# Patient Record
Sex: Male | Born: 1980 | Race: Black or African American | Hispanic: No | Smoking: Current every day smoker
Health system: Southern US, Community
[De-identification: ages and names within clinical notes are randomized; demographics above are authoritative.]

## PROBLEM LIST (undated history)

## (undated) ENCOUNTER — Emergency Department (HOSPITAL_COMMUNITY): Admission: EM | Disposition: A | Discharge status: Home / Self Care | Payer: Medicaid Other

## (undated) HISTORY — PX: NO PAST SURGERIES: SHX2092

---

## 2008-06-25 ENCOUNTER — Emergency Department (HOSPITAL_COMMUNITY): Admission: EM | Admit: 2008-06-25 | Discharge: 2008-06-25 | Payer: Self-pay | Admitting: Emergency Medicine

## 2008-06-26 ENCOUNTER — Emergency Department (HOSPITAL_COMMUNITY): Admission: EM | Admit: 2008-06-26 | Discharge: 2008-06-26 | Payer: Self-pay | Admitting: Emergency Medicine

## 2010-07-15 ENCOUNTER — Emergency Department (HOSPITAL_COMMUNITY): Admission: EM | Admit: 2010-07-15 | Discharge: 2010-07-16 | Payer: Self-pay | Admitting: Emergency Medicine

## 2010-09-05 ENCOUNTER — Emergency Department (HOSPITAL_COMMUNITY): Admission: EM | Admit: 2010-09-05 | Discharge: 2010-09-05 | Payer: Self-pay | Admitting: Family Medicine

## 2010-11-11 ENCOUNTER — Emergency Department (HOSPITAL_COMMUNITY)
Admission: EM | Admit: 2010-11-11 | Discharge: 2010-11-11 | Payer: Self-pay | Source: Home / Self Care | Admitting: Emergency Medicine

## 2011-01-02 ENCOUNTER — Emergency Department (HOSPITAL_COMMUNITY)
Admission: EM | Admit: 2011-01-02 | Discharge: 2011-01-02 | Disposition: A | Discharge status: Home / Self Care | Payer: Medicaid Other | Attending: Emergency Medicine | Admitting: Emergency Medicine

## 2011-01-02 DIAGNOSIS — J45909 Unspecified asthma, uncomplicated: Secondary | ICD-10-CM | POA: Insufficient documentation

## 2011-01-03 ENCOUNTER — Observation Stay (HOSPITAL_COMMUNITY)
Admission: EM | Admit: 2011-01-03 | Discharge: 2011-01-05 | Disposition: A | Discharge status: Home / Self Care | Payer: Medicaid Other | Attending: Infectious Diseases | Admitting: Infectious Diseases

## 2011-01-03 ENCOUNTER — Emergency Department (HOSPITAL_COMMUNITY): Payer: Medicaid Other

## 2011-01-03 DIAGNOSIS — J339 Nasal polyp, unspecified: Secondary | ICD-10-CM | POA: Insufficient documentation

## 2011-01-03 DIAGNOSIS — R0602 Shortness of breath: Secondary | ICD-10-CM

## 2011-01-03 DIAGNOSIS — R0609 Other forms of dyspnea: Secondary | ICD-10-CM

## 2011-01-03 DIAGNOSIS — R079 Chest pain, unspecified: Secondary | ICD-10-CM | POA: Insufficient documentation

## 2011-01-03 DIAGNOSIS — F17200 Nicotine dependence, unspecified, uncomplicated: Secondary | ICD-10-CM | POA: Insufficient documentation

## 2011-01-03 DIAGNOSIS — R059 Cough, unspecified: Secondary | ICD-10-CM | POA: Insufficient documentation

## 2011-01-03 DIAGNOSIS — R0989 Other specified symptoms and signs involving the circulatory and respiratory systems: Secondary | ICD-10-CM | POA: Insufficient documentation

## 2011-01-03 DIAGNOSIS — R05 Cough: Secondary | ICD-10-CM | POA: Insufficient documentation

## 2011-01-03 DIAGNOSIS — J45901 Unspecified asthma with (acute) exacerbation: Principal | ICD-10-CM | POA: Insufficient documentation

## 2011-01-03 LAB — CK TOTAL AND CKMB (NOT AT ARMC)
CK, MB: 5.8 ng/mL — ABNORMAL HIGH (ref 0.3–4.0)
Relative Index: 1.4 (ref 0.0–2.5)

## 2011-01-03 LAB — TSH: TSH: 0.273 u[IU]/mL — ABNORMAL LOW (ref 0.350–4.500)

## 2011-01-03 LAB — D-DIMER, QUANTITATIVE: D-Dimer, Quant: <0.22 ug/mL-FEU (ref 0.00–0.48)

## 2011-01-03 LAB — POCT CARDIAC MARKERS

## 2011-01-03 LAB — HIV ANTIBODY (ROUTINE TESTING W REFLEX): HIV: NONREACTIVE

## 2011-01-03 LAB — RAPID URINE DRUG SCREEN, HOSP PERFORMED
Opiates: NOT DETECTED
Tetrahydrocannabinol: NOT DETECTED

## 2011-01-03 LAB — TROPONIN I: Troponin I: <0.01 ng/mL (ref 0.00–0.06)

## 2011-01-03 LAB — HEPATITIS B SURFACE ANTIGEN: Hepatitis B Surface Ag: NEGATIVE

## 2011-01-04 ENCOUNTER — Observation Stay (HOSPITAL_COMMUNITY): Payer: Medicaid Other

## 2011-01-04 LAB — BASIC METABOLIC PANEL
BUN: 13 mg/dL (ref 6–23)
CO2: 26 mEq/L (ref 19–32)
Calcium: 9.4 mg/dL (ref 8.4–10.5)
Chloride: 105 mEq/L (ref 96–112)
Creatinine, Ser: 1.11 mg/dL (ref 0.4–1.5)
Glucose, Bld: 118 mg/dL — ABNORMAL HIGH (ref 70–99)

## 2011-01-04 LAB — CBC
HCT: 39.3 % (ref 39.0–52.0)
Platelets: 335 10*3/uL (ref 150–400)
RDW: 14.1 % (ref 11.5–15.5)
WBC: 19.1 10*3/uL — ABNORMAL HIGH (ref 4.0–10.5)

## 2011-01-05 DIAGNOSIS — R0609 Other forms of dyspnea: Secondary | ICD-10-CM

## 2011-01-05 DIAGNOSIS — R0989 Other specified symptoms and signs involving the circulatory and respiratory systems: Secondary | ICD-10-CM

## 2011-01-05 DIAGNOSIS — R0602 Shortness of breath: Secondary | ICD-10-CM

## 2011-01-05 LAB — BASIC METABOLIC PANEL
BUN: 18 mg/dL (ref 6–23)
Chloride: 105 mEq/L (ref 96–112)
Glucose, Bld: 127 mg/dL — ABNORMAL HIGH (ref 70–99)
Potassium: 4.4 mEq/L (ref 3.5–5.1)
Sodium: 142 mEq/L (ref 135–145)

## 2011-01-05 LAB — CBC
HCT: 41.9 % (ref 39.0–52.0)
MCV: 84.6 fL (ref 78.0–100.0)
Platelets: 342 10*3/uL (ref 150–400)
RBC: 4.95 MIL/uL (ref 4.22–5.81)
WBC: 17.1 10*3/uL — ABNORMAL HIGH (ref 4.0–10.5)

## 2011-01-05 LAB — CK TOTAL AND CKMB (NOT AT ARMC)
Relative Index: 1.4 (ref 0.0–2.5)
Total CK: 174 U/L (ref 7–232)

## 2011-01-06 LAB — CULTURE, RESPIRATORY W GRAM STAIN

## 2011-01-24 ENCOUNTER — Encounter: Payer: Self-pay | Admitting: Internal Medicine

## 2011-01-24 ENCOUNTER — Ambulatory Visit (INDEPENDENT_AMBULATORY_CARE_PROVIDER_SITE_OTHER): Payer: Medicaid Other | Admitting: Internal Medicine

## 2011-01-24 DIAGNOSIS — R946 Abnormal results of thyroid function studies: Secondary | ICD-10-CM | POA: Insufficient documentation

## 2011-01-24 DIAGNOSIS — J45909 Unspecified asthma, uncomplicated: Secondary | ICD-10-CM | POA: Insufficient documentation

## 2011-01-24 MED ORDER — ALBUTEROL SULFATE (5 MG/ML) 0.5% IN NEBU: 2.5000 mg | INHALATION_SOLUTION | Freq: Four times a day (QID) | RESPIRATORY_TRACT | Status: DC | PRN

## 2011-01-24 MED ORDER — FLUTICASONE PROPIONATE 50 MCG/ACT NA SUSP: 2.0000 | Freq: Every day | NASAL | Status: AC

## 2011-01-24 MED ORDER — IPRATROPIUM BROMIDE 0.02 % IN SOLN: 500.0000 ug | Freq: Four times a day (QID) | RESPIRATORY_TRACT | Status: DC

## 2011-01-24 MED ORDER — IPRATROPIUM-ALBUTEROL 18-103 MCG/ACT IN AERO: 2.0000 | INHALATION_SPRAY | Freq: Four times a day (QID) | RESPIRATORY_TRACT | Status: DC | PRN

## 2011-01-24 MED ORDER — FLUTICASONE PROPIONATE HFA 44 MCG/ACT IN AERO: 1.0000 | INHALATION_SPRAY | Freq: Two times a day (BID) | RESPIRATORY_TRACT | Status: AC

## 2011-01-24 NOTE — Assessment & Plan Note (Signed)
Incidental finding during hospitalization for asthma exacerbation. 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 Recheck TSH and T4 on follow up.

## 2011-01-24 NOTE — Patient Instructions (Signed)
Make follow up appointment in 6 months or as needed. Continue taking medication as directed.

## 2011-01-24 NOTE — Assessment & Plan Note (Signed)
Intermittent asthma. Stable. Continue current regimen.

## 2011-01-24 NOTE — Progress Notes (Signed)
  Subjective:    Patient ID: Nicholas [name], male    DOB: 1981/05/08, 30 y.o.   MRN: [id number]  HPI  30 yr old man with pmhx of Asthma  Comes to the clinic for follow up of hospital admission for asthma exacerbation  secondary to tobacco abuse.  Patient has no complaints. He reports that he has stopped smoking. Patient reports that since being discharged he has symptoms of wheezing less than 2 times per week  that are well controlled with medication.   Review of Systems  [all other systems reviewed and are negative       Objective:   Physical Exam  Constitutional: He is oriented to person, place, and time. He appears well-developed and well-nourished.  HENT:  Mouth/Throat: Oropharynx is clear and moist.  Eyes: Conjunctivae and EOM are normal. Pupils are equal, round, and reactive to light.  Neck: Normal range of motion. Neck supple.  Cardiovascular: Normal rate, regular rhythm and normal heart sounds.   Pulmonary/Chest: Effort normal and breath sounds normal.  Abdominal: Soft. Bowel sounds are normal.  Musculoskeletal: Normal range of motion.  Neurological: He is alert and oriented to person, place, and time.  Psychiatric: He has a normal mood and affect.          Assessment & Plan:
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
## 2011-08-05 ENCOUNTER — Emergency Department (HOSPITAL_COMMUNITY)
Admission: EM | Admit: 2011-08-05 | Discharge: 2011-08-06 | Disposition: A | Discharge status: Home / Self Care | Payer: Medicaid Other | Attending: Emergency Medicine | Admitting: Emergency Medicine

## 2011-08-05 DIAGNOSIS — R0602 Shortness of breath: Secondary | ICD-10-CM | POA: Insufficient documentation

## 2011-08-05 DIAGNOSIS — J45901 Unspecified asthma with (acute) exacerbation: Secondary | ICD-10-CM | POA: Insufficient documentation

## 2011-08-23 LAB — URINALYSIS, ROUTINE W REFLEX MICROSCOPIC
Bilirubin Urine: NEGATIVE
Glucose, UA: NEGATIVE
Ketones, ur: NEGATIVE
Ketones, ur: NEGATIVE
Nitrite: NEGATIVE
Nitrite: NEGATIVE
Protein, ur: NEGATIVE
Specific Gravity, Urine: 1.026
Urobilinogen, UA: 1
pH: 6

## 2011-08-23 LAB — URINE MICROSCOPIC-ADD ON

## 2011-08-23 LAB — HEPATIC FUNCTION PANEL
AST: 35
Bilirubin, Direct: 0.3
Indirect Bilirubin: 0.8
Total Bilirubin: 1.1

## 2011-08-23 LAB — CBC
HCT: 49
Hemoglobin: 15.9
MCHC: 32.4
MCV: 82.8
RBC: 5.92 — ABNORMAL HIGH
RDW: 15.3

## 2011-08-23 LAB — POCT I-STAT, CHEM 8
Chloride: 110
Glucose, Bld: 93
HCT: 52
Hemoglobin: 17.7 — ABNORMAL HIGH
Potassium: 4.4

## 2011-08-23 LAB — DIFFERENTIAL
Basophils Absolute: 0
Basophils Relative: 0
Eosinophils Absolute: 0.1
Eosinophils Relative: 1
Monocytes Absolute: 0.7
Monocytes Relative: 8

## 2011-09-22 ENCOUNTER — Emergency Department (HOSPITAL_COMMUNITY)
Admission: EM | Admit: 2011-09-22 | Discharge: 2011-09-22 | Disposition: A | Discharge status: Home / Self Care | Payer: Medicaid Other | Attending: Emergency Medicine | Admitting: Emergency Medicine

## 2011-09-22 DIAGNOSIS — F17200 Nicotine dependence, unspecified, uncomplicated: Secondary | ICD-10-CM | POA: Insufficient documentation

## 2011-09-22 DIAGNOSIS — J45902 Unspecified asthma with status asthmaticus: Secondary | ICD-10-CM | POA: Insufficient documentation

## 2012-04-13 ENCOUNTER — Other Ambulatory Visit: Payer: Self-pay | Admitting: Internal Medicine

## 2012-10-03 ENCOUNTER — Emergency Department (HOSPITAL_COMMUNITY)
Admission: EM | Admit: 2012-10-03 | Discharge: 2012-10-03 | Disposition: A | Discharge status: Home / Self Care | Payer: Medicaid Other | Attending: Emergency Medicine | Admitting: Emergency Medicine

## 2012-10-03 ENCOUNTER — Other Ambulatory Visit: Payer: Self-pay

## 2012-10-03 ENCOUNTER — Emergency Department (HOSPITAL_COMMUNITY): Payer: Medicaid Other

## 2012-10-03 ENCOUNTER — Encounter (HOSPITAL_COMMUNITY): Payer: Self-pay | Admitting: Emergency Medicine

## 2012-10-03 DIAGNOSIS — R05 Cough: Secondary | ICD-10-CM | POA: Insufficient documentation

## 2012-10-03 DIAGNOSIS — F17200 Nicotine dependence, unspecified, uncomplicated: Secondary | ICD-10-CM | POA: Insufficient documentation

## 2012-10-03 DIAGNOSIS — J45901 Unspecified asthma with (acute) exacerbation: Secondary | ICD-10-CM | POA: Insufficient documentation

## 2012-10-03 DIAGNOSIS — R059 Cough, unspecified: Secondary | ICD-10-CM | POA: Insufficient documentation

## 2012-10-03 DIAGNOSIS — Z79899 Other long term (current) drug therapy: Secondary | ICD-10-CM | POA: Insufficient documentation

## 2012-10-03 LAB — CBC WITH DIFFERENTIAL/PLATELET
Basophils Absolute: 0 10*3/uL (ref 0.0–0.1)
Basophils Relative: 0 % (ref 0–1)
Eosinophils Relative: 8 % — ABNORMAL HIGH (ref 0–5)
HCT: 42.3 % (ref 39.0–52.0)
MCHC: 32.6 g/dL (ref 30.0–36.0)
MCV: 83.4 fL (ref 78.0–100.0)
Monocytes Absolute: 0.2 10*3/uL (ref 0.1–1.0)
Platelets: 335 10*3/uL (ref 150–400)
RDW: 13.9 % (ref 11.5–15.5)

## 2012-10-03 LAB — BASIC METABOLIC PANEL
Calcium: 9.2 mg/dL (ref 8.4–10.5)
Creatinine, Ser: 1.08 mg/dL (ref 0.50–1.35)
GFR calc Af Amer: >90 mL/min (ref >90)
GFR calc non Af Amer: 89 mL/min — ABNORMAL LOW (ref >90)
Sodium: 135 mEq/L (ref 135–145)

## 2012-10-03 MED ORDER — ALBUTEROL (5 MG/ML) CONTINUOUS INHALATION SOLN
10.0000 mg/h | INHALATION_SOLUTION | RESPIRATORY_TRACT | Status: DC
  Administered 2012-10-03: 10 mg/h via RESPIRATORY_TRACT

## 2012-10-03 MED ORDER — PREDNISONE 10 MG PO TABS: 20.0000 mg | ORAL_TABLET | Freq: Every day | ORAL | Status: DC

## 2012-10-03 MED ORDER — IPRATROPIUM-ALBUTEROL 18-103 MCG/ACT IN AERO: 2.0000 | INHALATION_SPRAY | Freq: Four times a day (QID) | RESPIRATORY_TRACT | Status: DC | PRN

## 2012-10-03 MED ORDER — ALBUTEROL SULFATE (2.5 MG/3ML) 0.083% IN NEBU: 5.0000 mg | INHALATION_SOLUTION | RESPIRATORY_TRACT | Status: DC | PRN

## 2012-10-03 MED ORDER — ALBUTEROL SULFATE (5 MG/ML) 0.5% IN NEBU
INHALATION_SOLUTION | RESPIRATORY_TRACT | Status: AC
  Filled 2012-10-03: qty 2

## 2012-10-03 MED ORDER — ALBUTEROL SULFATE (5 MG/ML) 0.5% IN NEBU
INHALATION_SOLUTION | RESPIRATORY_TRACT | Status: AC
  Administered 2012-10-03: 10 mg/h via RESPIRATORY_TRACT
  Filled 2012-10-03: qty 2

## 2012-10-03 NOTE — ED Notes (Signed)
Pt presents via [facility] c/o SOB. Pt has hx of asthma and reports that he has been SOB for approx 2 weeks. Pt received 2 nebs and 125 solumedrol en route. Pt also reports that he has been out of his asthma meds for 2 months. Pt NAD and A&Ox4

## 2012-10-03 NOTE — ED Notes (Signed)
RUE:AV40<JW> Expected date:10/03/12<BR> Expected time: 9:52 AM<BR> Means of arrival:Ambulance<BR> Comments:<BR> Asthma

## 2012-10-03 NOTE — ED Provider Notes (Signed)
History     CSN: [id number]  Arrival date & time 10/03/12  1022   First MD Initiated Contact with Patient 10/03/12 1051      Chief Complaint  Patient presents with  . Shortness of Breath    (Consider location/radiation/quality/duration/timing/severity/associated sxs/prior treatment) HPI Pt with history of asthma p/w worsening wheezing x 1 week. States cold weather exacerbates symptoms. + non-productive cough. No fever or chills. No lower ext swelling or pain. Pt hospitalized last year for asthma exacerbation. No prev intubation. Given Solumedrol and neb by EMS prior to arrival. Pt states he is breathing much better.  Past Medical History  Diagnosis Date  . Asthma     History reviewed. No pertinent past surgical history.  No family history on file.  History  Substance Use Topics  . Smoking status: Current Every Day Smoker -- 0.5 packs/day    Types: Cigarettes    Last Attempt to Quit: 01/07/2011  . Smokeless tobacco: Not on file  . Alcohol Use: No      Review of Systems  Constitutional: Negative for fever and chills.  Respiratory: Positive for cough, shortness of breath and wheezing. Negative for chest tightness.   Cardiovascular: Negative for chest pain, palpitations and leg swelling.  Gastrointestinal: Negative for nausea, vomiting and abdominal pain.  Musculoskeletal: Negative for back pain.  Skin: Negative for rash.  Neurological: Negative for dizziness, weakness, light-headedness, numbness and headaches.    Allergies  Shrimp  Home Medications   Current Outpatient Rx  Name  Route  Sig  Dispense  Refill  . ALBUTEROL SULFATE (2.5 MG/3ML) 0.083% IN NEBU   Nebulization   Take 6 mLs (5 mg total) by nebulization every 4 (four) hours as needed for wheezing.   525 mL   2   . IPRATROPIUM-ALBUTEROL 18-103 MCG/ACT IN AERO   Inhalation   Inhale 2 puffs into the lungs every 6 (six) hours as needed for wheezing.   1 Inhaler   11   . PREDNISONE 10 MG PO TABS  Oral   Take 2 tablets (20 mg total) by mouth daily.   10 tablet   0     BP 131/71  Pulse 63  Temp 97.8 F (36.6 C) (Oral)  Resp 13  SpO2 94%  Physical Exam  Nursing note and vitals reviewed. Constitutional: He is oriented to person, place, and time. He appears well-developed and well-nourished. No distress.  HENT:  Head: Normocephalic and atraumatic.  Mouth/Throat: Oropharynx is clear and moist.  Eyes: EOM are normal. Pupils are equal, round, and reactive to light.  Neck: Normal range of motion. Neck supple.  Cardiovascular: Normal rate and regular rhythm.   Pulmonary/Chest: Effort normal. No respiratory distress. He has wheezes. He has no rales.       Inspiratory and expiratory wheezing  Abdominal: Soft. Bowel sounds are normal. He exhibits no distension and no mass. There is no tenderness. There is no rebound and no guarding.  Musculoskeletal: Normal range of motion. He exhibits no edema and no tenderness.       No calf swelling or tenderness  Neurological: He is alert and oriented to person, place, and time.  Skin: Skin is warm and dry. No rash noted. No erythema.  Psychiatric: He has a normal mood and affect. His behavior is normal.    ED Course  Procedures (including critical care time)  Labs Reviewed  CBC WITH DIFFERENTIAL - Abnormal; Notable for the following:    Eosinophils Relative 8 (*)  All other components within normal limits  BASIC METABOLIC PANEL - Abnormal; Notable for the following:    Glucose, Bld 111 (*)     GFR calc non Af Amer 89 (*)     All other components within normal limits   Dg Chest 2 View  10/03/2012  *RADIOLOGY REPORT*  Clinical Data: Wheezing and cough, weak.  Chest tightness.  History of asthma, smoking.  CHEST - 2 VIEW  Comparison: 01/04/2011  Findings: The lungs are hyperinflated.  There are perihilar bronchitic changes.  No focal consolidations or pleural effusions are identified.  No pulmonary edema.  Heart size is normal.  Visualized osseous structures have a normal appearance.  IMPRESSION:  1.  Hyperinflation bronchitic changes. 2. No focal pulmonary abnormality.   Original Report Authenticated By: Norva Pavlov, M.D.      1. Asthma exacerbation   2. Asthma       MDM  Pt states he is feeling much better after Neb. States he is at his baseline and would like to be d/c'd home.  Pt continues to have diffuse wheezing though VS including O2 sats are normal. Pt encouraged to return immediately for worsening symptoms, fever or any concerns        Loren Racer, MD 10/03/12 1359

## 2012-10-03 NOTE — ED Notes (Signed)
Per patient, positive response from breathing treatment

## 2014-01-06 ENCOUNTER — Emergency Department (HOSPITAL_COMMUNITY): Payer: Medicaid Other

## 2014-01-06 ENCOUNTER — Encounter (HOSPITAL_COMMUNITY): Payer: Self-pay | Admitting: Emergency Medicine

## 2014-01-06 ENCOUNTER — Emergency Department (HOSPITAL_COMMUNITY)
Admission: EM | Admit: 2014-01-06 | Discharge: 2014-01-06 | Disposition: A | Discharge status: Home / Self Care | Payer: Medicaid Other | Attending: Emergency Medicine | Admitting: Emergency Medicine

## 2014-01-06 DIAGNOSIS — F17200 Nicotine dependence, unspecified, uncomplicated: Secondary | ICD-10-CM | POA: Insufficient documentation

## 2014-01-06 DIAGNOSIS — J3489 Other specified disorders of nose and nasal sinuses: Secondary | ICD-10-CM | POA: Insufficient documentation

## 2014-01-06 DIAGNOSIS — Z79899 Other long term (current) drug therapy: Secondary | ICD-10-CM | POA: Insufficient documentation

## 2014-01-06 DIAGNOSIS — J45901 Unspecified asthma with (acute) exacerbation: Secondary | ICD-10-CM | POA: Insufficient documentation

## 2014-01-06 DIAGNOSIS — Z8701 Personal history of pneumonia (recurrent): Secondary | ICD-10-CM | POA: Insufficient documentation

## 2014-01-06 DIAGNOSIS — R6889 Other general symptoms and signs: Secondary | ICD-10-CM | POA: Insufficient documentation

## 2014-01-06 MED ORDER — ALBUTEROL SULFATE HFA 108 (90 BASE) MCG/ACT IN AERS: 2.0000 | INHALATION_SPRAY | RESPIRATORY_TRACT | Status: DC | PRN

## 2014-01-06 MED ORDER — IPRATROPIUM BROMIDE 0.02 % IN SOLN
1.0000 mg | Freq: Once | RESPIRATORY_TRACT | Status: AC
  Administered 2014-01-06: 1 mg via RESPIRATORY_TRACT

## 2014-01-06 MED ORDER — ALBUTEROL (5 MG/ML) CONTINUOUS INHALATION SOLN
INHALATION_SOLUTION | RESPIRATORY_TRACT | Status: AC
  Filled 2014-01-06: qty 20

## 2014-01-06 MED ORDER — PREDNISONE 20 MG PO TABS
60.0000 mg | ORAL_TABLET | Freq: Once | ORAL | Status: AC
  Administered 2014-01-06: 60 mg via ORAL
  Filled 2014-01-06: qty 3

## 2014-01-06 MED ORDER — ALBUTEROL (5 MG/ML) CONTINUOUS INHALATION SOLN
10.0000 mg/h | INHALATION_SOLUTION | RESPIRATORY_TRACT | Status: DC
  Administered 2014-01-06: 10 mg/h via RESPIRATORY_TRACT

## 2014-01-06 MED ORDER — IPRATROPIUM-ALBUTEROL 0.5-2.5 (3) MG/3ML IN SOLN: 3.0000 mL | RESPIRATORY_TRACT | Status: DC | PRN

## 2014-01-06 MED ORDER — PREDNISONE 20 MG PO TABS: ORAL_TABLET | ORAL | Status: DC

## 2014-01-06 MED ORDER — ALBUTEROL SULFATE HFA 108 (90 BASE) MCG/ACT IN AERS
2.0000 | INHALATION_SPRAY | Freq: Once | RESPIRATORY_TRACT | Status: AC
  Administered 2014-01-06: 2 via RESPIRATORY_TRACT
  Filled 2014-01-06: qty 6.7

## 2014-01-06 MED ORDER — IPRATROPIUM BROMIDE 0.02 % IN SOLN
RESPIRATORY_TRACT | Status: AC
  Filled 2014-01-06: qty 5

## 2014-01-06 NOTE — ED Notes (Signed)
Pt brought in by EMS, per report: Pt was short of breath due to an asthma exacerbation, pt has been out of asthma medication, was unable to his primary MD, started having cold symptoms on Monday, Coughing, runny nose,congestion, sneezing throat pain, symptoms got worse on Tuesday, used his sons inhaler with no relief.

## 2014-01-06 NOTE — ED Provider Notes (Signed)
CSN: [id number]     Arrival date & time 01/06/14  1703 History   First MD Initiated Contact with Patient 01/06/14 1717     Chief Complaint  Patient presents with  . Shortness of Breath  . Asthma     (Consider location/radiation/quality/duration/timing/severity/associated sxs/prior Treatment) HPI Comments: 33 year old male presents with asthma symptoms over the past 2-3 days. He states started with sneezing, rhinorrhea, and wheezing. He has run out of his own inhaler. He was using his son's albuterol with moderate relief. Denies fevers. Denies any current chest tightness or chest pain. He states he's coughing and with mostly clear sputum with a little bit of yellow. He was started on albuterol by EMS and feels somewhat better. He has a history of pneumonia but states this is not as bad as that time. Denies any current pain.   Past Medical History  Diagnosis Date  . Asthma    No past surgical history on file. No family history on file. History  Substance Use Topics  . Smoking status: Current Every Day Smoker -- 0.50 packs/day    Types: Cigarettes    Last Attempt to Quit: 01/07/2011  . Smokeless tobacco: Not on file  . Alcohol Use: No    Review of Systems  Constitutional: Negative for fever.  HENT: Positive for congestion, rhinorrhea and sneezing. Negative for sore throat.   Respiratory: Positive for cough, shortness of breath and wheezing.   Cardiovascular: Negative for chest pain.  Gastrointestinal: Negative for abdominal pain.  All other systems reviewed and are negative.      Allergies  Shrimp  Home Medications   Current Outpatient Rx  Name  Route  Sig  Dispense  Refill  . albuterol (PROVENTIL HFA;VENTOLIN HFA) 108 (90 BASE) MCG/ACT inhaler   Inhalation   Inhale 2 puffs into the lungs every 6 (six) hours as needed for wheezing or shortness of breath.         Marland Kitchen. albuterol (PROVENTIL) (2.5 MG/3ML) 0.083% nebulizer solution   Nebulization   Take 6 mLs (5 mg total)  by nebulization every 4 (four) hours as needed for wheezing.   525 mL   2    BP 134/76  Pulse 70  Temp(Src) 98.3 F (36.8 C) (Oral)  Resp 12  SpO2 96% Physical Exam  Nursing note and vitals reviewed. Constitutional: He is oriented to person, place, and time. He appears well-developed and well-nourished. No distress.  Talks in nearly complete sentences  HENT:  Head: Normocephalic and atraumatic.  Right Ear: External ear normal.  Left Ear: External ear normal.  Nose: Nose normal.  Eyes: Right eye exhibits no discharge. Left eye exhibits no discharge.  Neck: Neck supple.  Cardiovascular: Normal rate, regular rhythm, normal heart sounds and intact distal pulses.   Pulmonary/Chest: Effort normal. No respiratory distress. He has wheezes.  Abdominal: Soft. He exhibits no distension. There is no tenderness.  Musculoskeletal: He exhibits no edema.  Neurological: He is alert and oriented to person, place, and time.  Skin: Skin is warm and dry.    ED Course  Procedures (including critical care time) Labs Review Labs Reviewed - No data to display Imaging Review Dg Chest 2 View  01/06/2014   CLINICAL DATA:  Wheezing and shortness of breath  EXAM: CHEST  2 VIEW  COMPARISON:  10/03/2012  FINDINGS: The heart size and mediastinal contours are within normal limits. Both lungs are clear. The visualized skeletal structures are unremarkable.  IMPRESSION: No active cardiopulmonary disease.   Electronically Signed  By: Alcide Clever M.D.   On: 01/06/2014 17:48    EKG Interpretation   None       MDM   Final diagnoses:  Asthma exacerbation    Patient significantly improved with breathing treatments and steroids. This is c/w a typical asthma exacerbation. No hypoxia. He has no signs of PNA. Likely a URI causing asthma exacerbation. Given albuterol inhaler here, and will d/c with burst of steroids. He walked to bathroom without difficulty, including no dyspnea or labored breathing. Feel he is  stable for discharge. Discussed strict return precautions.     Audree Camel, MD 01/06/14 2140

## 2014-01-06 NOTE — ED Notes (Signed)
Bed: WA07 Expected date:  Expected time:  Means of arrival:  Comments: ems 

## 2014-01-31 ENCOUNTER — Institutional Professional Consult (permissible substitution): Payer: Medicaid Other | Admitting: Internal Medicine

## 2014-02-09 ENCOUNTER — Ambulatory Visit (INDEPENDENT_AMBULATORY_CARE_PROVIDER_SITE_OTHER): Payer: Medicaid Other | Admitting: Internal Medicine

## 2014-02-09 ENCOUNTER — Encounter: Payer: Self-pay | Admitting: Internal Medicine

## 2014-02-09 ENCOUNTER — Encounter (INDEPENDENT_AMBULATORY_CARE_PROVIDER_SITE_OTHER): Payer: Self-pay

## 2014-02-09 VITALS — BP 124/64 | HR 66 | Temp 98.6°F | Ht 69.0 in | Wt 176.0 lb

## 2014-02-09 DIAGNOSIS — J4520 Mild intermittent asthma, uncomplicated: Secondary | ICD-10-CM

## 2014-02-09 DIAGNOSIS — J45909 Unspecified asthma, uncomplicated: Secondary | ICD-10-CM

## 2014-02-09 MED ORDER — BUDESONIDE-FORMOTEROL FUMARATE 160-4.5 MCG/ACT IN AERO: INHALATION_SPRAY | RESPIRATORY_TRACT | Status: DC

## 2014-02-09 NOTE — Assessment & Plan Note (Signed)
DDX of  difficult airways managment all start with A and  include Adherence, Ace Inhibitors, Acid Reflux, Active Sinus Disease, Alpha 1 Antitripsin deficiency, Anxiety masquerading as Airways dz,  ABPA,  allergy(esp in young), Aspiration (esp in elderly), Adverse effects of DPI,  Active smokers, plus two Bs  = Bronchiectasis and Beta blocker use..and one C= CHF  In this case Adherence is the biggest issue and starts with  inability to use HFA effectively and also  understand that SABA treats the symptoms but doesn't get to the underlying problem (inflammation).  I used  the analogy of putting steroid cream on a rash to help explain the meaning of topical therapy and the need to get the drug to the target tissue.   The proper method of use, as well as anticipated side effects, of a metered-dose inhaler are discussed and demonstrated to the patient. Improved effectiveness after extensive coaching during this visit to a level of approximately  75% so try symbicort 160 2 bid then return for pfts and consider step down rx  ?Active smoking > off x 1 months, congratulated and reinforced  See instructions for specific recommendations which were reviewed directly with the patient who was given a copy with highlighter outlining the key components.

## 2014-02-09 NOTE — Patient Instructions (Addendum)
symbicort 160 Take 2 puffs first thing in am and then another 2 puffs about 12 hours later.   Only use your albuterol as a rescue medication to be used if you can't catch your breath by resting or doing a relaxed purse lip breathing pattern.  - The less you use it, the better it will work when you need it. - Ok to use up to 2 puffs  every 4 hours if you must but call for immediate appointment if use goes up over your usual need - Don't leave home without it !!  (think of it like the spare tire for your car)   Please schedule a follow up office visit in 6 weeks, call sooner if needed with pfts on return

## 2014-02-09 NOTE — Progress Notes (Signed)
   Subjective:    Patient ID: Nicholas [name], male    DOB: 09/18/1981  MRN: [id number]  HPI  89 yobm with bad bronchitis as child but resolved around age 33 then dx as asthma age 33 after started smoking but quit completely 12/2013 and [facility].   02/09/2014 1st [facility] office visit/ Anyssa Sharpless  Chief Complaint  Patient presents with  . Pulmonary Consult    [facility]. [facility] for [facility]  using albuterol hfa each am  > lasts about 6 hours in relief of wheezing and sob, not aerobically active.  No obvious other patterns in day to day or daytime variabilty or assoc chronic cough or cp or chest tightness, subjective wheeze overt sinus or hb symptoms. No unusual exp hx or h/o childhood pna/ asthma or knowledge of premature birth.  Sleeping ok without nocturnal  or early am exacerbation  of respiratory  c/o's or need for noct saba. Also denies any obvious fluctuation of symptoms with weather or environmental changes or other aggravating or alleviating factors except as outlined above   Current Medications, Allergies, Complete Past Medical History, Past Surgical History, Family History, and Social History were reviewed in [facility] electronic medical record.            Review of Systems  Constitutional: Negative for fever, chills, diaphoresis, activity change, appetite change, fatigue and unexpected weight change.  HENT: Positive for sneezing. Negative for congestion, dental problem, ear discharge, ear pain, facial swelling, hearing loss, mouth sores, nosebleeds, postnasal drip, rhinorrhea, sinus pressure, sore throat, tinnitus, trouble swallowing and voice change.   Eyes: Negative for photophobia, discharge, itching and visual disturbance.  Respiratory: Positive for shortness of breath and wheezing. Negative for apnea, cough, choking, chest tightness and stridor.   Cardiovascular: Negative for chest pain, palpitations and leg swelling.   Gastrointestinal: Negative for nausea, vomiting, abdominal pain, constipation, blood in stool and abdominal distention.       Heartburn Indigestion  Genitourinary: Negative for dysuria, urgency, frequency, hematuria, flank pain, decreased urine volume and difficulty urinating.  Musculoskeletal: Negative for arthralgias, back pain, gait problem, joint swelling, myalgias, neck pain and neck stiffness.  Skin: Negative for color change, pallor and rash.  Neurological: Negative for dizziness, tremors, seizures, syncope, speech difficulty, weakness, light-headedness, numbness and headaches.  Hematological: Negative for adenopathy. Does not bruise/bleed easily.  Psychiatric/Behavioral: Negative for confusion, sleep disturbance and agitation. The patient is not nervous/anxious.        Objective:   Physical Exam  Wt Readings from Last 3 Encounters:  02/09/14 176 lb (79.833 kg)  01/24/11 180 lb 8 oz (81.874 kg)     HEENT: nl dentition, turbinates, and orophanx. Nl external ear canals without cough reflex   NECK :  without JVD/Nodes/TM/ nl carotid upstrokes bilaterally   LUNGS: no acc muscle use, trace mid exp wheeze bilaterally    CV:  RRR  no s3 or murmur or increase in P2, no edema   ABD:  soft and nontender with nl excursion in the supine position. No bruits or organomegaly, bowel sounds nl  MS:  warm without deformities, calf tenderness, cyanosis or clubbing  SKIN: warm and dry without lesions    NEURO:  alert, approp, no deficits    cxr 01/06/14  No active cardiopulmonary disease.       Assessment & Plan:

## 2014-03-01 ENCOUNTER — Telehealth: Payer: Self-pay | Admitting: Internal Medicine

## 2014-03-01 MED ORDER — BUDESONIDE-FORMOTEROL FUMARATE 160-4.5 MCG/ACT IN AERO: INHALATION_SPRAY | RESPIRATORY_TRACT | Status: DC

## 2014-03-01 NOTE — Telephone Encounter (Signed)
Spoke with pt. Aware I have sent symbicort to his pharm. Nothing further needed

## 2014-03-04 ENCOUNTER — Telehealth: Payer: Self-pay | Admitting: Internal Medicine

## 2014-03-04 NOTE — Telephone Encounter (Signed)
Called [facility] and they never received RX for symbicort sent the other day. Gave VO to catrina Pt aware nothing further needed

## 2014-03-23 ENCOUNTER — Ambulatory Visit (INDEPENDENT_AMBULATORY_CARE_PROVIDER_SITE_OTHER): Payer: Medicaid Other | Admitting: Internal Medicine

## 2014-03-23 ENCOUNTER — Encounter (INDEPENDENT_AMBULATORY_CARE_PROVIDER_SITE_OTHER): Payer: Self-pay

## 2014-03-23 ENCOUNTER — Encounter: Payer: Self-pay | Admitting: Internal Medicine

## 2014-03-23 VITALS — BP 112/68 | HR 77 | Temp 98.9°F | Ht 70.0 in | Wt 181.0 lb

## 2014-03-23 DIAGNOSIS — J45909 Unspecified asthma, uncomplicated: Secondary | ICD-10-CM

## 2014-03-23 DIAGNOSIS — F17200 Nicotine dependence, unspecified, uncomplicated: Secondary | ICD-10-CM

## 2014-03-23 DIAGNOSIS — J4520 Mild intermittent asthma, uncomplicated: Secondary | ICD-10-CM

## 2014-03-23 NOTE — Patient Instructions (Addendum)
Symbiocrt 160 Take 2 puffs first thing in am and then another 2 puffs about 12 hours later.     Only use your albuterol (yellow is proventil)  as a rescue medication to be used if you can't catch your breath by resting or doing a relaxed purse lip breathing pattern.  - The less you use it, the better it will work when you need it. - Ok to use up to 2 puffs  every 4 hours if you must but call for immediate appointment if use goes up over your usual need - Don't leave home without it !!  (think of it like the spare tire for your car)   The key is to stop smoking completely before smoking completely stops you - it is not too late in your case    If you are satisfied with your treatment plan let your doctor know and he/she can either refill your medications or you can return here when your prescription runs out.     If in any way you are not 100% satisfied,  please tell us.  If 100% better, tell your friends!

## 2014-03-23 NOTE — Progress Notes (Signed)
   Subjective:    Patient ID: Nicholas [name], male    DOB: 12/11/1980  MRN: [id number]    Brief patient profile:  33 yobm with bad bronchitis as child but resolved around age 33 then dx as asthma age 33 [facility] for [facility] with completely nl pfts while one laba/ics documented 03/23/2014    02/09/2014 1st [facility] office visit/ Nicholas [name]  Chief Complaint  Patient presents with  . Pulmonary Consult    referred by Dr. [facility] for Asthma  using albuterol hfa each am  > lasts about 6 hours in relief of wheezing and sob, not aerobically active. rec symbicort 160 Take 2 puffs first thing in am and then another 2 puffs about 12 hours later.  Only use your albuterol as needed   03/23/2014 f/u ov/Nicholas [name] re: asthma/ still smoking/ on symbicort 160 2bid/ saba qam (not the instructions given) Chief Complaint  Patient presents with  . Follow-up    Pt had PFT's today. He states that his breathing is overall doing well. He is using rescue inhaler 2 puffs per day.     No obvious other patterns in day to day or daytime variabilty or assoc chronic cough or cp or chest tightness, subjective wheeze overt sinus or hb symptoms. No unusual exp hx or h/o childhood pna/ asthma or knowledge of premature birth.  Sleeping ok without nocturnal  or early am exacerbation  of respiratory  c/o's or need for noct saba. Also denies any obvious fluctuation of symptoms with weather or environmental changes or other aggravating or alleviating factors except as outlined above   Current Medications, Allergies, Complete Past Medical History, Past Surgical History, Family History, and Social History were reviewed in [facility] electronic medical record.          Objective:   Physical Exam  03/23/2014       181  Wt Readings from Last 3 Encounters:  02/09/14 176 lb (79.833 kg)  01/24/11 180 lb 8 oz (81.874 kg)     HEENT: nl dentition, turbinates, and  orophanx. Nl external ear canals without cough reflex   NECK :  without JVD/Nodes/TM/ nl carotid upstrokes bilaterally   LUNGS: no acc muscle use,     CV:  RRR  no s3 or murmur or increase in P2, no edema   ABD:  soft and nontender with nl excursion in the supine position. No bruits or organomegaly, bowel sounds nl  MS:  warm without deformities, calf tenderness, cyanosis or clubbing  SKIN: warm and dry without lesions    NEURO:  alert, approp, no deficits    cxr 01/06/14  No active cardiopulmonary disease.       Assessment & Plan:

## 2014-03-23 NOTE — Progress Notes (Signed)
PFT done today. 

## 2014-03-25 DIAGNOSIS — F17210 Nicotine dependence, cigarettes, uncomplicated: Secondary | ICD-10-CM | POA: Insufficient documentation

## 2014-03-25 NOTE — Assessment & Plan Note (Addendum)
-   PFT's 03/23/2014 nl p am symbicort   Despite active smoking doing quite well but misunderstood saba instructions    Each maintenance medication was reviewed in detail including most importantly the difference between maintenance and as needed and under what circumstances the prns are to be used.  Please see instructions for details which were reviewed in writing and the patient given a copy.    The proper method of use, as well as anticipated side effects, of a metered-dose inhaler are discussed and demonstrated to the patient. Improved effectiveness after extensive coaching during this visit to a level of approximately  90%

## 2014-04-11 LAB — PULMONARY FUNCTION TEST
DL/VA % pred: 109 %
DL/VA: 5.12 ml/min/mmHg/L
DLCO UNC % PRED: 115 %
DLCO UNC: 37.22 ml/min/mmHg
FEF 25-75 PRE: 3.21 L/s
FEF 25-75 Post: 3.48 L/sec
FEF2575-%CHANGE-POST: 8 %
FEF2575-%PRED-PRE: 80 %
FEF2575-%Pred-Post: 87 %
FEV1-%Change-Post: 2 %
FEV1-%PRED-PRE: 113 %
FEV1-%Pred-Post: 116 %
FEV1-PRE: 4.2 L
FEV1-Post: 4.3 L
FEV1FVC-%Change-Post: 3 %
FEV1FVC-%Pred-Pre: 89 %
FEV6-%CHANGE-POST: 0 %
FEV6-%Pred-Post: 127 %
FEV6-%Pred-Pre: 128 %
FEV6-POST: 5.62 L
FEV6-Pre: 5.67 L
FEV6FVC-%CHANGE-POST: 0 %
FEV6FVC-%Pred-Post: 101 %
FEV6FVC-%Pred-Pre: 101 %
FVC-%Change-Post: 0 %
FVC-%PRED-POST: 126 %
FVC-%PRED-PRE: 127 %
FVC-Post: 5.64 L
FVC-Pre: 5.68 L
POST FEV1/FVC RATIO: 76 %
POST FEV6/FVC RATIO: 100 %
Pre FEV1/FVC ratio: 74 %
Pre FEV6/FVC Ratio: 100 %
RV % pred: 118 %
RV: 2.05 L
TLC % pred: 112 %
TLC: 7.78 L

## 2014-05-25 ENCOUNTER — Emergency Department (INDEPENDENT_AMBULATORY_CARE_PROVIDER_SITE_OTHER)
Admission: EM | Admit: 2014-05-25 | Discharge: 2014-05-25 | Disposition: A | Discharge status: Home / Self Care | Payer: Worker's Compensation | Source: Home / Self Care | Attending: Family Medicine | Admitting: Family Medicine

## 2014-05-25 ENCOUNTER — Encounter (HOSPITAL_COMMUNITY): Payer: Self-pay | Admitting: Emergency Medicine

## 2014-05-25 DIAGNOSIS — Z23 Encounter for immunization: Secondary | ICD-10-CM

## 2014-05-25 DIAGNOSIS — Y990 Civilian activity done for income or pay: Secondary | ICD-10-CM

## 2014-05-25 DIAGNOSIS — W268XXA Contact with other sharp object(s), not elsewhere classified, initial encounter: Secondary | ICD-10-CM

## 2014-05-25 DIAGNOSIS — S61409A Unspecified open wound of unspecified hand, initial encounter: Secondary | ICD-10-CM

## 2014-05-25 DIAGNOSIS — S61411A Laceration without foreign body of right hand, initial encounter: Secondary | ICD-10-CM

## 2014-05-25 DIAGNOSIS — Y93G9 Activity, other involving cooking and grilling: Secondary | ICD-10-CM

## 2014-05-25 DIAGNOSIS — Y9229 Other specified public building as the place of occurrence of the external cause: Secondary | ICD-10-CM

## 2014-05-25 DIAGNOSIS — T1490XA Injury, unspecified, initial encounter: Secondary | ICD-10-CM

## 2014-05-25 MED ORDER — TETANUS-DIPHTH-ACELL PERTUSSIS 5-2.5-18.5 LF-MCG/0.5 IM SUSP
0.5000 mL | Freq: Once | INTRAMUSCULAR | Status: AC
  Administered 2014-05-25: 0.5 mL via INTRAMUSCULAR

## 2014-05-25 MED ORDER — TETANUS-DIPHTH-ACELL PERTUSSIS 5-2.5-18.5 LF-MCG/0.5 IM SUSP
INTRAMUSCULAR | Status: AC
  Filled 2014-05-25: qty 0.5

## 2014-05-25 MED ORDER — CEPHALEXIN 500 MG PO CAPS: 500.0000 mg | ORAL_CAPSULE | Freq: Four times a day (QID) | ORAL | Status: DC

## 2014-05-25 NOTE — ED Notes (Signed)
Attempted to pry apart frozen meat patties w sharp knife in right hand, knife slipped, caused stab type wound to left palm . Minimal bleeding at present, aprox 3 cm lac noted, denies other injury

## 2014-05-25 NOTE — ED Provider Notes (Signed)
Nicholas [name] is a 33 y.o. male who presents to Urgent Care today for right hand laceration. 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 He accidentally stabbed himself in the ulnar part of the volar hand this evening just prior to presentation. He washed the wound out after the initial injury. He denies any weakness or numbness. He is not sure about his last tetanus vaccination. No fevers or chills nausea vomiting or diarrhea.   Past Medical History  Diagnosis Date  . Asthma    History  Substance Use Topics  . Smoking status: Current Every Day Smoker -- 0.25 packs/day for 18 years    Types: Cigarettes  . Smokeless tobacco: Not on file  . Alcohol Use: No     Comment: quit 5 years ago   ROS as above Medications: No current facility-administered medications for this encounter.   Current Outpatient Prescriptions  Medication Sig Dispense Refill  . albuterol (PROVENTIL HFA;VENTOLIN HFA) 108 (90 BASE) MCG/ACT inhaler Inhale 2 puffs into the lungs every 6 (six) hours as needed for wheezing or shortness of breath.      . budesonide-formoterol (SYMBICORT) 160-4.5 MCG/ACT inhaler Take 2 puffs first thing in am and then another 2 puffs about 12 hours later.  1 Inhaler  11  . cephALEXin (KEFLEX) 500 MG capsule Take 1 capsule (500 mg total) by mouth 4 (four) times daily.  28 capsule  0    Exam:  BP 115/75  Pulse 71  Temp(Src) 98.4 F (36.9 C) (Oral)  Resp 16  SpO2 96% Gen: Well NAD Right hand: 2 cm laceration of the ulnar aspect of the volar hand. The laceration extends through the dermis but does not involve any deep structures. Sensation pulses capillary refill and motion are all intact.   Laceration repair:  Consent obtained and timeout performed. 2 mL of lidocaine with epinephrine were injected achieving good anesthesia The wound was copiously irrigated with sterile saline. Betadine was used to prep the wound. The wound was draped in the usual sterile fashion.  4-0 Ethilon was used to close  the wound with 3 courses on a mattress sutures.  Patient tolerated the procedure  No results found for this or any previous visit (from the past 24 hour(s)). No results found.  Assessment and Plan: 33 y.o. male with right hand laceration. Does not involve tendons. Work related injury.  Tetanus vaccination provided. Prophylactic treatment with Keflex. Return in one week for suture removal.  Discussed warning signs or symptoms. Please see discharge instructions. Patient expresses understanding.    Rodolph BongEvan S Mitzy Naron, MD 05/25/14 2117

## 2014-05-25 NOTE — Discharge Instructions (Signed)
Thank you for coming in today. ° °Laceration Care, Adult °A laceration is a cut or lesion that goes through all layers of the skin and into the tissue just beneath the skin. °TREATMENT  °Some lacerations may not require closure. Some lacerations may not be able to be closed due to an increased risk of infection. It is important to see your caregiver as soon as possible after an injury to minimize the risk of infection and maximize the opportunity for successful closure. °If closure is appropriate, pain medicines may be given, if needed. The wound will be cleaned to help prevent infection. Your caregiver will use stitches (sutures), staples, wound glue (adhesive), or skin adhesive strips to repair the laceration. These tools bring the skin edges together to allow for faster healing and a better cosmetic outcome. However, all wounds will heal with a scar. Once the wound has healed, scarring can be minimized by covering the wound with sunscreen during the day for 1 full year. °HOME CARE INSTRUCTIONS  °For sutures or staples: °· Keep the wound clean and dry. °· If you were given a bandage (dressing), you should change it at least once a day. Also, change the dressing if it becomes wet or dirty, or as directed by your caregiver. °· Wash the wound with soap and water 2 times a day. Rinse the wound off with water to remove all soap. Pat the wound dry with a clean towel. °· After cleaning, apply a thin layer of the antibiotic ointment as recommended by your caregiver. This will help prevent infection and keep the dressing from sticking. °· You may shower as usual after the first 24 hours. Do not soak the wound in water until the sutures are removed. °· Only take over-the-counter or prescription medicines for pain, discomfort, or fever as directed by your caregiver. °· Get your sutures or staples removed as directed by your caregiver. °For skin adhesive strips: °· Keep the wound clean and dry. °· Do not get the skin adhesive  strips wet. You may bathe carefully, using caution to keep the wound dry. °· If the wound gets wet, pat it dry with a clean towel. °· Skin adhesive strips will fall off on their own. You may trim the strips as the wound heals. Do not remove skin adhesive strips that are still stuck to the wound. They will fall off in time. °For wound adhesive: °· You may briefly wet your wound in the shower or bath. Do not soak or scrub the wound. Do not swim. Avoid periods of heavy perspiration until the skin adhesive has fallen off on its own. After showering or bathing, gently pat the wound dry with a clean towel. °· Do not apply liquid medicine, cream medicine, or ointment medicine to your wound while the skin adhesive is in place. This may loosen the film before your wound is healed. °· If a dressing is placed over the wound, be careful not to apply tape directly over the skin adhesive. This may cause the adhesive to be pulled off before the wound is healed. °· Avoid prolonged exposure to sunlight or tanning lamps while the skin adhesive is in place. Exposure to ultraviolet light in the first year will darken the scar. °· The skin adhesive will usually remain in place for 5 to 10 days, then naturally fall off the skin. Do not pick at the adhesive film. °You may need a tetanus shot if: °· You cannot remember when you had your last tetanus shot. °·   You have never had a tetanus shot. °If you get a tetanus shot, your arm may swell, get red, and feel warm to the touch. This is common and not a problem. If you need a tetanus shot and you choose not to have one, there is a rare chance of getting tetanus. Sickness from tetanus can be serious. °SEEK MEDICAL CARE IF:  °· You have redness, swelling, or increasing pain in the wound. °· You see a red line that goes away from the wound. °· You have yellowish-white fluid (pus) coming from the wound. °· You have a fever. °· You notice a bad smell coming from the wound or dressing. °· Your  wound breaks open before or after sutures have been removed. °· You notice something coming out of the wound such as wood or glass. °· Your wound is on your hand or foot and you cannot move a finger or toe. °SEEK IMMEDIATE MEDICAL CARE IF:  °· Your pain is not controlled with prescribed medicine. °· You have severe swelling around the wound causing pain and numbness or a change in color in your arm, hand, leg, or foot. °· Your wound splits open and starts bleeding. °· You have worsening numbness, weakness, or loss of function of any joint around or beyond the wound. °· You develop painful lumps near the wound or on the skin anywhere on your body. °MAKE SURE YOU:  °· Understand these instructions. °· Will watch your condition. °· Will get help right away if you are not doing well or get worse. °Document Released: 11/11/2005 Document Revised: 02/03/2012 Document Reviewed: 05/07/2011 °ExitCare® Patient Information ©2015 [employer]. This information is not intended to replace advice given to you by your health care provider. Make sure you discuss any questions you have with your health care provider. ° °

## 2014-06-01 ENCOUNTER — Emergency Department (HOSPITAL_COMMUNITY)
Admission: EM | Admit: 2014-06-01 | Discharge: 2014-06-01 | Discharge status: Absent without leave | Payer: Medicaid Other | Source: Home / Self Care

## 2014-06-02 ENCOUNTER — Encounter (HOSPITAL_COMMUNITY): Payer: Self-pay | Admitting: Emergency Medicine

## 2014-06-02 ENCOUNTER — Emergency Department (INDEPENDENT_AMBULATORY_CARE_PROVIDER_SITE_OTHER)
Admission: EM | Admit: 2014-06-02 | Discharge: 2014-06-02 | Disposition: A | Discharge status: Home / Self Care | Payer: Worker's Compensation | Source: Home / Self Care | Attending: Emergency Medicine | Admitting: Emergency Medicine

## 2014-06-02 DIAGNOSIS — Z4802 Encounter for removal of sutures: Secondary | ICD-10-CM

## 2014-06-02 NOTE — ED Provider Notes (Signed)
Medical screening examination/treatment/procedure(s) were performed by non-physician practitioner and as supervising physician I was immediately available for consultation/collaboration.  Leslee Homeavid Jodilyn Giese, M.D.  Reuben Likesavid C Lunden Mcleish, MD 06/02/14 [phone]

## 2014-06-02 NOTE — ED Provider Notes (Signed)
CSN: [id number]     Arrival date & time 06/02/14  0845 History   First MD Initiated Contact with Patient 06/02/14 [phone]     Chief Complaint  Patient presents with  . Suture / Staple Removal   (Consider location/radiation/quality/duration/timing/severity/associated sxs/prior Treatment) HPI Comments: Patient presents for suture removal. He had 3 hyperthenar eminence of his right hand one week ago. The pain is getting better. He denies any drainage or signs of infection. He has been keeping it clean and covered. He has returned to work without difficulty.  Patient is a 33 y.o. male presenting with suture removal.  Suture / Staple Removal    Past Medical History  Diagnosis Date  . Asthma    History reviewed. No pertinent past surgical history. History reviewed. No pertinent family history. History  Substance Use Topics  . Smoking status: Current Every Day Smoker -- 0.25 packs/day for 18 years    Types: Cigarettes  . Smokeless tobacco: Not on file  . Alcohol Use: No     Comment: quit 5 years ago    Review of Systems  Skin: Positive for wound.  All other systems reviewed and are negative.   Allergies  Shrimp and Latex  Home Medications   Prior to Admission medications   Medication Sig Start Date End Date Taking? Authorizing Provider  albuterol (PROVENTIL HFA;VENTOLIN HFA) 108 (90 BASE) MCG/ACT inhaler Inhale 2 puffs into the lungs every 6 (six) hours as needed for wheezing or shortness of breath.    Historical Provider, MD  budesonide-formoterol (SYMBICORT) 160-4.5 MCG/ACT inhaler Take 2 puffs first thing in am and then another 2 puffs about 12 hours later. 03/01/14   Nyoka CowdenMichael B Wert, MD  cephALEXin (KEFLEX) 500 MG capsule Take 1 capsule (500 mg total) by mouth 4 (four) times daily. 05/25/14   Rodolph BongEvan S Corey, MD   BP 122/76  Pulse 72  Temp(Src) 98.6 F (37 C) (Oral)  Resp 14  SpO2 100% Physical Exam  Nursing note and vitals reviewed. Constitutional: He is oriented to person,  place, and time. He appears well-developed and well-nourished. No distress.  HENT:  Head: Normocephalic.  Pulmonary/Chest: Effort normal. No respiratory distress.  Musculoskeletal:       Hands: Neurological: He is alert and oriented to person, place, and time. Coordination normal.  Skin: Skin is warm and dry. No rash noted. He is not diaphoretic.  Psychiatric: He has a normal mood and affect. Judgment normal.    ED Course  Procedures (including critical care time) Labs Review Labs Reviewed - No data to display  Imaging Review No results found.   MDM   1. Visit for suture removal    Sutures removed, Steri-Strips placed. Watch for signs of wound infection. Followup as needed. No further scheduled followup    Graylon GoodZachary H Kennidee Heyne, PA-C 06/02/14 [phone]

## 2014-06-02 NOTE — Discharge Instructions (Signed)

## 2014-06-02 NOTE — ED Notes (Signed)
PT  HAS  WELL  HEALING  SUTURE  LINE  HERE  FOR  REMOVAL  OF  SUTURES   UTD  ON  TETANUS  SHOT

## 2014-10-12 ENCOUNTER — Telehealth: Payer: Self-pay | Admitting: Internal Medicine

## 2014-10-12 NOTE — Telephone Encounter (Signed)
Form received from [place] [facility] for the PA for the symbicort.  This form has been placed in MW file to be signed and completed.  Will need to faxed to [phone] Call for information  [phone] PT ID# [id number] R  [facility].

## 2014-10-13 NOTE — Telephone Encounter (Signed)
PA form completed and faxed  Will await approval/denial  

## 2014-10-18 MED ORDER — BUDESONIDE-FORMOTEROL FUMARATE 160-4.5 MCG/ACT IN AERO: INHALATION_SPRAY | RESPIRATORY_TRACT | Status: DC

## 2014-10-18 NOTE — Telephone Encounter (Signed)
Spoke with Rep at [place] [facility]  She states that the med will be covered after 10/25/14  There was a gap in his ins coverage from 09/24/14 until 10/25/14 Spoke with the pt and notified of this and he verbalized understanding  I will leave 1 symbicort sample up front to last until 10/25/14 Nothing further needed

## 2015-05-12 ENCOUNTER — Other Ambulatory Visit: Payer: Self-pay | Admitting: Internal Medicine

## 2016-03-20 ENCOUNTER — Ambulatory Visit (INDEPENDENT_AMBULATORY_CARE_PROVIDER_SITE_OTHER): Payer: Medicaid Other | Admitting: Internal Medicine

## 2016-03-20 ENCOUNTER — Ambulatory Visit (INDEPENDENT_AMBULATORY_CARE_PROVIDER_SITE_OTHER)
Admission: RE | Admit: 2016-03-20 | Discharge: 2016-03-20 | Disposition: A | Discharge status: Home / Self Care | Payer: Medicaid Other | Source: Ambulatory Visit | Attending: Internal Medicine | Admitting: Internal Medicine

## 2016-03-20 ENCOUNTER — Telehealth: Payer: Self-pay | Admitting: Internal Medicine

## 2016-03-20 ENCOUNTER — Encounter: Payer: Self-pay | Admitting: Internal Medicine

## 2016-03-20 VITALS — BP 110/60 | HR 65 | Ht 71.0 in | Wt 162.8 lb

## 2016-03-20 DIAGNOSIS — J4530 Mild persistent asthma, uncomplicated: Secondary | ICD-10-CM | POA: Diagnosis not present

## 2016-03-20 DIAGNOSIS — Z720 Tobacco use: Secondary | ICD-10-CM

## 2016-03-20 DIAGNOSIS — F17200 Nicotine dependence, unspecified, uncomplicated: Secondary | ICD-10-CM

## 2016-03-20 MED ORDER — ALBUTEROL SULFATE HFA 108 (90 BASE) MCG/ACT IN AERS: INHALATION_SPRAY | RESPIRATORY_TRACT | Status: DC

## 2016-03-20 MED ORDER — BUDESONIDE-FORMOTEROL FUMARATE 160-4.5 MCG/ACT IN AERO: INHALATION_SPRAY | RESPIRATORY_TRACT | Status: DC

## 2016-03-20 NOTE — Patient Instructions (Signed)
Plan A = Automatic = Symbicort 160 Take 2 puffs first thing in am and then another 2 puffs about 12 hours later.      Plan B = Backup Only use your albuterol (PROAIR) as a rescue medication to be used if you can't catch your breath by resting or doing a relaxed purse lip breathing pattern.  - The less you use it, the better it will work when you need it. - Ok to use the inhaler up to 2 puffs  every 4 hours if you must but call for appointment if use goes up over your usual need - Don't leave home without it !!  (think of it like the spare tire for your car)      The key is to stop smoking completely before smoking completely stops you - it's not too late - ok to try the e cigs again  Please remember to go to the x-ray department downstairs for your tests - we will call you with the results when they are available.      If you are satisfied with your treatment plan,  let your doctor know and he/she can either refill your medications or you can return here when your prescription runs out.     If in any way you are not 100% satisfied,  please tell us.  If 100% better, tell your friends!  Pulmonary follow up is as needed

## 2016-03-20 NOTE — Progress Notes (Signed)
Quick Note:  Spoke with pt and notified of results per Dr. Wert. Pt verbalized understanding and denied any questions.  ______ 

## 2016-03-20 NOTE — Telephone Encounter (Signed)
Spoke with pt. Pt is requesting a rx and samples of symbicort. Pt states his PCP passed away and he is unable to receive any refills on his symbicort and has been out for a month. I informed him that he would need to be seen since he has not been seen since 4/15. I scheduled him for today at 10:30 with MW. He voiced understanding and had no further questions. Pt will await in lobby until appointment time. Nothing further needed.

## 2016-03-20 NOTE — Progress Notes (Signed)
Subjective:    Patient ID: Nicholas [name], male    DOB: 10/13/1981  MRN: [id number]    Brief patient profile:  35 yobm with bad bronchitis as child but resolved around age 35 then dx as asthma age 35 after [facility] for asthma with completely nl pfts while one laba/ics documented 03/23/2014    02/09/2014 1st [facility] office visit/ Nicholas [name]  Chief Complaint  Patient presents with  . Pulmonary Consult    referred by Dr. [facility] for [facility]hma  using albuterol hfa each am  > lasts about 6 hours in relief of wheezing and sob, not aerobically active. rec symbicort 160 Take 2 puffs first thing in am and then another 2 puffs about 12 hours later.  Only use your albuterol as needed   03/23/2014 f/u ov/Nicholas [name] re: asthma/ still smoking/ on symbicort 160 2bid/ saba qam (not the instructions given) Chief Complaint  Patient presents with  . Follow-up    Pt had PFT's today. He states that his breathing is overall doing well. He is using rescue inhaler 2 puffs per day.   rec Symbiocrt 160 Take 2 puffs first thing in am and then another 2 puffs about 12 hours later.  Only use your albuterol (yellow is proventil)  as a rescue medication  The key is to stop smoking completely before smoking completely stops you - it is not too late in your case    03/20/2016  f/u ov/Nicholas [name] re: asthma/ still smoking  Chief Complaint  Patient presents with  . Follow-up    Pt states his breathing is overall doing well. He ran out of symbicort 3 wks ago and has been using his son's albuterol inhaler 2 x daily on average. No new co's today.    does fine as long as stays on symbicort despite smoking but much worse gradually with sob > cough when off it  No obvious day to day or daytime variability or assoc excess/ purulent sputum or mucus plugs or hemoptysis or cp or chest tightness, subjective wheeze or overt sinus or hb symptoms. No unusual exp hx or h/o childhood pna/  asthma or knowledge of premature birth.  Sleeping ok without nocturnal  or early am exacerbation  of respiratory  c/o's or need for noct saba. Also denies any obvious fluctuation of symptoms with weather or environmental changes or other aggravating or alleviating factors except as outlined above   Current Medications, Allergies, Complete Past Medical History, Past Surgical History, Family History, and Social History were reviewed in [facility] electronic medical record.  ROS  The following are not active complaints unless bolded sore throat, dysphagia, dental problems, itching, sneezing,  nasal congestion or excess/ purulent secretions, ear ache,   fever, chills, sweats, unintended wt loss, classically pleuritic or exertional cp,  orthopnea pnd or leg swelling, presyncope, palpitations, abdominal pain, anorexia, nausea, vomiting, diarrhea  or change in bowel or bladder habits, change in stools or urine, dysuria,hematuria,  rash, arthralgias, visual complaints, headache, numbness, weakness or ataxia or problems with walking or coordination,  change in mood/affect or memory.               Objective:   Physical Exam  03/23/2014       181 >  03/20/2016 163     02/09/14 176 lb (79.833 kg)  01/24/11 180 lb 8 oz (81.874 kg)     HEENT: nl dentition, turbinates, and orophanx. Nl external ear canals without cough  reflex   NECK :  without JVD/Nodes/TM/ nl carotid upstrokes bilaterally   LUNGS: no acc muscle use,  insp and exp rhonchi bilaterally slt more prominent on R    CV:  RRR  no s3 or murmur or increase in P2, no edema   ABD:  soft and nontender with nl excursion in the supine position. No bruits or organomegaly, bowel sounds nl  MS:  warm without deformities, calf tenderness, cyanosis or clubbing  SKIN: warm and dry without lesions    NEURO:  alert, approp, no deficits       I personally reviewed images and agree with radiology impression as follows:  CXR:  03/20/2016 No  acute cardiopulmonary process. Mild pulmonary hyperinflation attributed to reactive airways disease.       Assessment & Plan:

## 2016-03-21 NOTE — Assessment & Plan Note (Signed)

## 2016-03-21 NOTE — Assessment & Plan Note (Signed)
-   PFT's 03/23/2014 nl p am symbicort   - 03/20/2016  After extensive coaching HFA effectiveness =    75%   Risks evolving to copd if continues to smoke (see separate a/p) but clincally doing fine on symb 160 2bid as maint rx   I had an extended discussion with the patient reviewing all relevant studies completed to date and  lasting 15 to 20 minutes of a 25 minute visit    Each maintenance medication was reviewed in detail including most importantly the difference between maintenance and prns and under what circumstances the prns are to be triggered using an action plan format that is not reflected in the computer generated alphabetically organized AVS.    Please see instructions for details which were reviewed in writing and the patient given a copy highlighting the part that I personally wrote and discussed at today's ov.

## 2016-11-22 ENCOUNTER — Encounter (HOSPITAL_COMMUNITY): Payer: Self-pay | Admitting: Emergency Medicine

## 2016-11-22 ENCOUNTER — Ambulatory Visit (HOSPITAL_COMMUNITY)
Admission: EM | Admit: 2016-11-22 | Discharge: 2016-11-22 | Disposition: A | Discharge status: Home / Self Care | Payer: Medicaid Other | Attending: Family Medicine | Admitting: Family Medicine

## 2016-11-22 DIAGNOSIS — S39012A Strain of muscle, fascia and tendon of lower back, initial encounter: Secondary | ICD-10-CM | POA: Diagnosis not present

## 2016-11-22 MED ORDER — DICLOFENAC POTASSIUM 50 MG PO TABS
50.0000 mg | ORAL_TABLET | Freq: Three times a day (TID) | ORAL | 0 refills | Status: DC
Start: 2016-11-22 — End: 2019-09-08

## 2016-11-22 MED ORDER — CYCLOBENZAPRINE HCL 5 MG PO TABS: 5.0000 mg | ORAL_TABLET | Freq: Three times a day (TID) | ORAL | 0 refills | Status: DC

## 2016-11-22 NOTE — ED Triage Notes (Signed)
Pt injured his lower back over a year ago.  He has had a recurrent issue with it over the last year.  On christmas day he states he pulled the back again and he is unable to work with the pain, as it is right now.

## 2016-11-22 NOTE — ED Provider Notes (Signed)
[facility]    CSN: [id number] Arrival date & time: 11/22/16  1055     History   Chief Complaint Chief Complaint  Patient presents with  . Back Pain    HPI Jon Gillsheodore Lembke is a 35 y.o. male.   The history is provided by the patient.  Back Pain  Location:  Lumbar spine Quality:  Stiffness Radiates to:  Does not radiate Pain severity:  Moderate Onset quality:  Gradual Duration:  1 week Progression:  Partially resolved Chronicity:  Recurrent Context: lifting heavy objects and twisting   Relieved by:  None tried Worsened by:  Nothing Ineffective treatments:  None tried Associated symptoms: no abdominal pain, no abdominal swelling, no bladder incontinence, no bowel incontinence, no dysuria, no fever, no leg pain, no numbness and no tingling     Past Medical History:  Diagnosis Date  . Asthma     Patient Active Problem List   Diagnosis Date Noted  . Smoker 03/25/2014  . Chronic asthma 01/24/2011  . Abnormal thyroid blood test 01/24/2011    History reviewed. No pertinent surgical history.     Home Medications    Prior to Admission medications   Medication Sig Start Date End Date Taking? Authorizing Provider  budesonide-formoterol (SYMBICORT) 160-4.5 MCG/ACT inhaler Take 2 puffs first thing in am and then another 2 puffs about 12 hours later. 03/20/16  Yes Nyoka CowdenMichael B Wert, MD  albuterol [facility](PROAIR HFA) 108 [phone](90 Base) MCG/ACT inhaler 2 puffs every 4 hours as needed only  if your can't catch your breath 03/20/16   Nyoka CowdenMichael B Wert, MD    Family History History reviewed. No pertinent family history.  Social History Social History  Substance Use Topics  . Smoking status: Current Every Day Smoker    Packs/day: 0.25    Years: 18.00    Types: Cigarettes  . Smokeless tobacco: Never Used  . Alcohol use No     Comment: quit 5 years ago     Allergies   Shrimp [shellfish allergy] and Latex   Review of Systems Review of Systems  Constitutional: Negative  for fever.  Gastrointestinal: Negative for abdominal pain and bowel incontinence.  Genitourinary: Negative for bladder incontinence and dysuria.  Musculoskeletal: Positive for back pain.  Neurological: Negative for tingling and numbness.     Physical Exam Triage Vital Signs ED Triage Vitals [11/22/16 1133]  Enc Vitals Group     BP 99/58     Pulse Rate 72     Resp      Temp 98.6 F (37 C)     Temp Source Oral     SpO2 100 %     Weight      Height      Head Circumference      Peak Flow      Pain Score 5     Pain Loc      Pain Edu?      Excl. in GC?    No data found.   Updated Vital Signs BP 99/58 (BP Location: Left Arm)   Pulse 72   Temp 98.6 F (37 C) (Oral)   SpO2 100%   Visual Acuity Right Eye Distance:   Left Eye Distance:   Bilateral Distance:    Right Eye Near:   Left Eye Near:    Bilateral Near:     Physical Exam   UC Treatments / Results  Labs (all labs ordered are listed, but only abnormal results are displayed) Labs Reviewed - No  data to display  EKG  EKG Interpretation None       Radiology No results found.  Procedures Procedures (including critical care time)  Medications Ordered in UC Medications - No data to display   Initial Impression / Assessment and Plan / UC Course  I have reviewed the triage vital signs and the nursing notes.  Pertinent labs & imaging results that were available during my care of the patient were reviewed by me and considered in my medical decision making (see chart for details).  Clinical Course       Final Clinical Impressions(s) / UC Diagnoses   Final diagnoses:  None    New Prescriptions New Prescriptions   No medications on file     Linna HoffJames D Kindl, MD 11/22/16 2135

## 2016-12-23 ENCOUNTER — Encounter (HOSPITAL_COMMUNITY): Payer: Self-pay | Admitting: Emergency Medicine

## 2016-12-23 ENCOUNTER — Emergency Department (HOSPITAL_COMMUNITY)
Admission: EM | Admit: 2016-12-23 | Discharge: 2016-12-23 | Disposition: A | Discharge status: Home / Self Care | Payer: Medicaid Other | Attending: Emergency Medicine | Admitting: Emergency Medicine

## 2016-12-23 DIAGNOSIS — Z91040 Latex allergy status: Secondary | ICD-10-CM | POA: Diagnosis not present

## 2016-12-23 DIAGNOSIS — F17210 Nicotine dependence, cigarettes, uncomplicated: Secondary | ICD-10-CM | POA: Insufficient documentation

## 2016-12-23 DIAGNOSIS — R05 Cough: Secondary | ICD-10-CM | POA: Diagnosis present

## 2016-12-23 DIAGNOSIS — Z79899 Other long term (current) drug therapy: Secondary | ICD-10-CM | POA: Diagnosis not present

## 2016-12-23 DIAGNOSIS — J111 Influenza due to unidentified influenza virus with other respiratory manifestations: Secondary | ICD-10-CM | POA: Insufficient documentation

## 2016-12-23 DIAGNOSIS — J45909 Unspecified asthma, uncomplicated: Secondary | ICD-10-CM | POA: Diagnosis not present

## 2016-12-23 DIAGNOSIS — R69 Illness, unspecified: Secondary | ICD-10-CM

## 2016-12-23 MED ORDER — OSELTAMIVIR PHOSPHATE 75 MG PO CAPS: 75.0000 mg | ORAL_CAPSULE | Freq: Two times a day (BID) | ORAL | 0 refills | Status: DC

## 2016-12-23 MED ORDER — FLUTICASONE PROPIONATE 50 MCG/ACT NA SUSP: 2.0000 | Freq: Every day | NASAL | 0 refills | Status: DC

## 2016-12-23 MED ORDER — HYDROCODONE-HOMATROPINE 5-1.5 MG/5ML PO SYRP: 5.0000 mL | ORAL_SOLUTION | Freq: Four times a day (QID) | ORAL | 0 refills | Status: DC | PRN

## 2016-12-23 MED ORDER — BENZONATATE 100 MG PO CAPS: 100.0000 mg | ORAL_CAPSULE | Freq: Three times a day (TID) | ORAL | 0 refills | Status: DC

## 2016-12-23 MED ORDER — GUAIFENESIN 100 MG/5ML PO LIQD: 100.0000 mg | ORAL | 0 refills | Status: DC | PRN

## 2016-12-23 NOTE — ED Provider Notes (Signed)
[facility] Provider Note   CSN: [id number] Arrival date & time: 12/23/16  1248   By signing my name below, I, Avnee Patel, attest that this documentation has been prepared under the direction and in the presence of  [employer]. Electronically Signed: Clovis Pu, ED Scribe. 12/23/16. 1:57 PM.   History   Chief Complaint Chief Complaint  Patient presents with  . Flu Like Symptoms   The history is provided by the patient. No language interpreter was used.   HPI Comments:  Nicholas [name] is a 36 y.o. male, with a hx of asthma, tobacco abuse who presents to the Emergency Department complaining of generalized body aches x 3 days. Pt also reports a productive cough with yellow/green sputum, sore throat and low grade fever. He has taken Nyquil with no significant relief. Pt denies nasal congestion or any other associated symptoms. Pt uses a Symbicort inhaler for his asthma but notes he has not had to used it more than usual. Pt reports wife is in other room with similar symptoms and son also has similar symptoms.  Past Medical History:  Diagnosis Date  . Asthma     Patient Active Problem List   Diagnosis Date Noted  . Smoker 03/25/2014  . Chronic asthma 01/24/2011  . Abnormal thyroid blood test 01/24/2011    History reviewed. No pertinent surgical history.     Home Medications    Prior to Admission medications   Medication Sig Start Date End Date Taking? Authorizing Provider  albuterol (PROAIR HFA) 108 (90 Base) MCG/ACT inhaler 2 puffs every 4 hours as needed only  if your can't catch your breath 03/20/16   Nyoka Cowden, MD  benzonatate (TESSALON) 100 MG capsule Take 1 capsule (100 mg total) by mouth every 8 (eight) hours. 12/23/16   Liberty Handy, PA-C  budesonide-formoterol (SYMBICORT) 160-4.5 MCG/ACT inhaler Take 2 puffs first thing in am and then another 2 puffs about 12 hours later. 03/20/16   Nyoka Cowden, MD  cyclobenzaprine (FLEXERIL) 5 MG tablet  Take 1 tablet (5 mg total) by mouth 3 (three) times daily. As muscle relaxer 11/22/16   Linna Hoff, MD  diclofenac (CATAFLAM) 50 MG tablet Take 1 tablet (50 mg total) by mouth 3 (three) times daily. 11/22/16   Linna Hoff, MD  fluticasone (FLONASE) 50 MCG/ACT nasal spray Place 2 sprays into both nostrils daily. 12/23/16   Liberty Handy, PA-C  guaiFENesin (ROBITUSSIN) 100 MG/5ML liquid Take 5-10 mLs (100-200 mg total) by mouth every 4 (four) hours as needed for cough. 12/23/16   Liberty Handy, PA-C  HYDROcodone-homatropine (HYCODAN) 5-1.5 MG/5ML syrup Take 5 mLs by mouth every 6 (six) hours as needed for cough. 12/23/16   Liberty Handy, PA-C  oseltamivir (TAMIFLU) 75 MG capsule Take 1 capsule (75 mg total) by mouth every 12 (twelve) hours. 12/23/16   Liberty Handy, PA-C    Family History No family history on file.  Social History Social History  Substance Use Topics  . Smoking status: Current Every Day Smoker    Packs/day: 0.25    Years: 18.00    Types: Cigarettes  . Smokeless tobacco: Never Used  . Alcohol use No     Comment: quit 5 years ago     Allergies   Shrimp [shellfish allergy] and Latex   Review of Systems Review of Systems  Constitutional: Positive for fever. Negative for chills.  HENT: Positive for sore throat. Negative for congestion.   Eyes:  Negative for visual disturbance.  Respiratory: Positive for cough. Negative for chest tightness and shortness of breath.   Cardiovascular: Negative for chest pain.  Gastrointestinal: Negative for abdominal pain, constipation, diarrhea, nausea and vomiting.  Genitourinary: Negative for decreased urine volume and difficulty urinating.  Musculoskeletal: Positive for myalgias. Negative for arthralgias and joint swelling.  Skin: Negative for rash.  Neurological: Negative for dizziness, light-headedness and headaches.   Physical Exam Updated Vital Signs BP 125/77   Pulse 65   Temp 98.3 F (36.8 C) (Oral)    Resp 15   Ht 5\' 10"  (1.778 m)   Wt 77.1 kg   SpO2 100%   BMI 24.39 kg/m   Physical Exam  Constitutional: He is oriented to person, place, and time. He appears well-developed and well-nourished. No distress.  NAD.  HENT:  Head: Normocephalic and atraumatic.  Right Ear: External ear normal.  Left Ear: External ear normal.  Nose: Mucosal edema present.  Mouth/Throat: Oropharynx is clear and moist. No oropharyngeal exudate, posterior oropharyngeal edema or posterior oropharyngeal erythema.  Head: No lesions on scalp. Skull and facial bones symmetric, non-tender without bony abnormalities. Frontal and maxillary sinuses are non-tender to percussion. Eyes: Lids symmetrical without lag or palpable mass. Sclera white without prominent vessels. Conjunctiva pink. PERRL bilaterally.  Ears: Both external ears withut lesions, swelling, deformities or tenderness is mastoid areas. R and L external ear auditory canals clear without edema or erythema.  TMs pearly gray with visible cone of light and bony landmarks bilaterally.  Nose: Nasal mucosa pink. Moderate mucosal edema bilaterally. No nasal discharge. No sinus tenderness. Septum midline.  Throat: Lips are pink and symmetrical. Dentition normal.  Gingiva, labial and buccal mucosa pink without lesions, tenderness or fluctuance. Oropharynx and tonsils moist without erythema, edema or exudates. Uvula midline. No trismus.   Eyes: Conjunctivae and EOM are normal. Pupils are equal, round, and reactive to light. No scleral icterus.  Neck: Normal range of motion. Neck supple. No JVD present. No tracheal deviation present.  Cardiovascular: Normal rate, regular rhythm, normal heart sounds and intact distal pulses.   No murmur heard. Pulmonary/Chest: Effort normal. He has wheezes (LLL inspiratory wheezing cleared with cough).  RR within normal limits. SpO2 within normal limits.  Normal breathing effort. Patient speaking in full sentences. No pursed lip  breathing. Chest wall expansion symmetric.  No chest wall tenderness. Lungs CTAB anteriorly and posteriorly without rhonchi or rales. Left lower lobe inspiratory wheezing cleared with cough. No egophony.    Abdominal: Soft. He exhibits no distension. There is no tenderness.  Musculoskeletal: Normal range of motion. He exhibits no deformity.  Lymphadenopathy:    He has no cervical adenopathy.  Neurological: He is alert and oriented to person, place, and time.  Skin: Skin is warm and dry. Capillary refill takes less than 2 seconds.  Psychiatric: He has a normal mood and affect. His behavior is normal. Judgment and thought content normal.  Nursing note and vitals reviewed.   ED Treatments / Results  DIAGNOSTIC STUDIES:  Oxygen Saturation is 100% on RA, normal by my interpretation.    COORDINATION OF CARE:  1:56 PM Discussed treatment plan with pt at bedside and pt agreed to plan.  Labs (all labs ordered are listed, but only abnormal results are displayed) Labs Reviewed - No data to display  EKG  EKG Interpretation None       Radiology No results found.  Procedures Procedures (including critical care time)  Medications Ordered in ED Medications - No  data to display   Initial Impression / Assessment and Plan / ED Course  I have reviewed the triage vital signs and the nursing notes.  Pertinent labs & imaging results that were available during my care of the patient were reviewed by me and considered in my medical decision making (see chart for details).     Patient with symptoms consistent with influenza.  Vitals are stable. No signs of dehydration, tolerating PO's. Due to patient's presentation and physical exam a chest x-ray was not ordered bc likely diagnosis of flu. Patient will be discharged with TamiFlu (given h/o asthma) and instructions to orally hydrate, rest, and use over-the-counter medications for symptoms.  ED return instructions given.  Patient is aware that  a viral upper respiratory tract infection may precede the onset of acute bronchitis, pneumonia. Patient is aware of red flag symptoms to monitor for that would warrant return to the emergency department for further reevaluation. Pt ambulated with pulse ox within normal limits prior to discharge.    Final Clinical Impressions(s) / ED Diagnoses   Final diagnoses:  Influenza-like illness    New Prescriptions Discharge Medication List as of 12/23/2016  2:22 PM    START taking these medications   Details  benzonatate (TESSALON) 100 MG capsule Take 1 capsule (100 mg total) by mouth every 8 (eight) hours., Starting Mon 12/23/2016, Print    fluticasone (FLONASE) 50 MCG/ACT nasal spray Place 2 sprays into both nostrils daily., Starting Mon 12/23/2016, Print    guaiFENesin (ROBITUSSIN) 100 MG/5ML liquid Take 5-10 mLs (100-200 mg total) by mouth every 4 (four) hours as needed for cough., Starting Mon 12/23/2016, Print    HYDROcodone-homatropine (HYCODAN) 5-1.5 MG/5ML syrup Take 5 mLs by mouth every 6 (six) hours as needed for cough., Starting Mon 12/23/2016, Print    oseltamivir (TAMIFLU) 75 MG capsule Take 1 capsule (75 mg total) by mouth every 12 (twelve) hours., Starting Mon 12/23/2016, Print       I personally performed the services described in this documentation, which was scribed in my presence. The recorded information has been reviewed and is accurate.     Liberty Handy, PA-C 12/23/16 1507    Linwood Dibbles, MD 12/24/16 [phone]

## 2016-12-23 NOTE — ED Triage Notes (Signed)
Patient is complaining of productive cough and generalized body aches x2 days.

## 2016-12-23 NOTE — Discharge Instructions (Signed)
Your symptoms are most likely due to a viral upper respiratory infection, possibly the flu.  We will treat your symptoms.  Please use flonase for nasal congestion, this prevent you from developing headaches.  Please take mucinex for chest congestion and phlegm. Take Tessalon for disruptive day time cough and Hycodan for body aches and disruptive night time come.  Make sure you drink plenty of water, stay hydrated and rest.   You have also been prescribed Tamiflu for prevention of influenza complications.   Monitor your symptoms and ensure that they are improving in 7-10 days.

## 2017-04-12 ENCOUNTER — Other Ambulatory Visit: Payer: Self-pay | Admitting: Internal Medicine

## 2017-04-12 DIAGNOSIS — J4530 Mild persistent asthma, uncomplicated: Secondary | ICD-10-CM

## 2017-07-06 ENCOUNTER — Other Ambulatory Visit: Payer: Self-pay | Admitting: Internal Medicine

## 2017-07-06 DIAGNOSIS — J4530 Mild persistent asthma, uncomplicated: Secondary | ICD-10-CM

## 2017-08-01 ENCOUNTER — Telehealth: Payer: Self-pay | Admitting: Internal Medicine

## 2017-08-01 DIAGNOSIS — J4530 Mild persistent asthma, uncomplicated: Secondary | ICD-10-CM

## 2017-08-01 MED ORDER — BUDESONIDE-FORMOTEROL FUMARATE 160-4.5 MCG/ACT IN AERO: INHALATION_SPRAY | RESPIRATORY_TRACT | 0 refills | Status: DC

## 2017-08-01 NOTE — Telephone Encounter (Signed)
Called and spoke with pt and he is aware of appt needed for refills.  This has been made for 9-11 and pt is aware.  Refill has been sent to the pharmacy.

## 2017-08-05 ENCOUNTER — Ambulatory Visit: Payer: Self-pay | Admitting: Internal Medicine

## 2018-05-15 ENCOUNTER — Encounter: Payer: Self-pay | Admitting: Pulmonary Disease

## 2018-05-15 ENCOUNTER — Ambulatory Visit (INDEPENDENT_AMBULATORY_CARE_PROVIDER_SITE_OTHER): Payer: Self-pay | Admitting: Pulmonary Disease

## 2018-05-15 VITALS — BP 118/80 | HR 86 | Temp 97.3°F | Ht 70.0 in | Wt 160.4 lb

## 2018-05-15 DIAGNOSIS — J4530 Mild persistent asthma, uncomplicated: Secondary | ICD-10-CM

## 2018-05-15 DIAGNOSIS — J4541 Moderate persistent asthma with (acute) exacerbation: Secondary | ICD-10-CM

## 2018-05-15 DIAGNOSIS — F17200 Nicotine dependence, unspecified, uncomplicated: Secondary | ICD-10-CM

## 2018-05-15 LAB — POCT EXHALED NITRIC OXIDE: FeNO level (ppb): 36

## 2018-05-15 MED ORDER — BUDESONIDE-FORMOTEROL FUMARATE 160-4.5 MCG/ACT IN AERO: 2.0000 | INHALATION_SPRAY | Freq: Two times a day (BID) | RESPIRATORY_TRACT | 0 refills | Status: DC

## 2018-05-15 MED ORDER — LEVALBUTEROL HCL 0.63 MG/3ML IN NEBU: 0.6300 mg | INHALATION_SOLUTION | RESPIRATORY_TRACT | Status: AC

## 2018-05-15 MED ORDER — BUDESONIDE-FORMOTEROL FUMARATE 160-4.5 MCG/ACT IN AERO: INHALATION_SPRAY | RESPIRATORY_TRACT | 6 refills | Status: DC

## 2018-05-15 NOTE — Progress Notes (Signed)
@Patient  ID: Nicholas [name], male    DOB: 1981-09-30, 37 y.o.   MRN: [id number]  Chief Complaint  Patient presents with  . 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 SOB with productive cough with yellow and green phlegm with increased wheezing at night. Reports having chills despite being hot. Would like to start something to help him quit smoking. Has been out of symbicort for 1 month.     Referring provider: No ref. provider found  HPI: 48 yobm with bad bronchitis as child but resolved around age 60 then dx as asthma age 72 after started smoking referred by [facility] 02/09/2014 to pulmonary clinic for asthma with completely nl pfts while one laba/ics documented 03/23/2014   4-1/2-pack-year history.  Current smoker.   Recent [facility] Encounters:   03/20/2016  f/u ov/Wert re: asthma/ still smoking  Chief Complaint  Patient presents with  . Follow-up    Pt states his breathing is overall doing well. He ran out of symbicort 3 wks ago and has been using his son's albuterol inhaler 2 x daily on average. No new co's today.    does fine as long as stays on symbicort despite smoking but much worse gradually with sob > cough when off it  No obvious day to day or daytime variability or assoc excess/ purulent sputum or mucus plugs or hemoptysis or cp or chest tightness, subjective wheeze or overt sinus or hb symptoms. No unusual exp hx or h/o childhood pna/ asthma or knowledge of premature birth.  Sleeping ok without nocturnal  or early am exacerbation  of respiratory  c/o's or need for noct saba. Also denies any obvious fluctuation of symptoms with weather or environmental changes or other aggravating or alleviating factors except as outlined above    Tests:  03/23/2014-pulmonary function test- ratio is normal, airway resistance is normal, no significant bronchodilator response, diffusing capacity is  normal    05/15/18 Acute  Patient reporting to office today with 2 weeks of symptoms of wheezing and chest tightness.  Patient reporting they have been off there Symbicort 160 for about a month now.  Patient reporting that weeks ago he was at a job at a [employer] where he felt all the dust and particles may have affected his breathing.  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  Patient wanted to be seen in office today.  Patient reporting chills and feeling like he has a fever but is afebrile as he returns our office.  Unfortunately patient is still smoker and is down to 5 cigarettes a day.  4 1/2-year pack history.   Allergies  Allergen Reactions  . Shrimp [Shellfish Allergy] Anaphylaxis  . Latex Itching    Immunization History  Administered Date(s) Administered  . Tdap 05/25/2014    Past Medical History:  Diagnosis Date  . Asthma     Tobacco History: Social History   Tobacco Use  Smoking Status Current Every Day Smoker  . Packs/day: 0.25  . Years: 18.00  . Pack years: 4.50  . Types: Cigarettes  Smokeless Tobacco Never Used  Tobacco Comment   5 cigarettes/day 05/15/18   Ready to quit: Yes Counseling given: Yes Comment: 5 cigarettes/day 05/15/18 Patient is ready and willing to quit.  Discussed importance of stopping smoking.  Patient has been working on this.  Continue  to do reduced to quit.  Will provide information such as 1 800 quit now as well as [facility] smoking cessation classes.   Outpatient Encounter Medications as of 05/15/2018  Medication Sig  . albuterol (PROAIR HFA) 108 (90 Base) MCG/ACT inhaler 2 puffs every 4 hours as needed only  if your can't catch your breath  . benzonatate (TESSALON) 100 MG capsule Take 1 capsule (100 mg total) by mouth every 8 (eight) hours.  . budesonide-formoterol (SYMBICORT) 160-4.5 MCG/ACT inhaler INHALE 2 PUFFS FIRST THING IN THE MORNING THEN 2 PUFFS 12 HOURS  LATER  . cyclobenzaprine (FLEXERIL) 5 MG tablet Take 1 tablet (5 mg total) by mouth 3 (three) times daily. As muscle relaxer  . diclofenac (CATAFLAM) 50 MG tablet Take 1 tablet (50 mg total) by mouth 3 (three) times daily.  . fluticasone (FLONASE) 50 MCG/ACT nasal spray Place 2 sprays into both nostrils daily.  Marland Kitchen guaiFENesin (ROBITUSSIN) 100 MG/5ML liquid Take 5-10 mLs (100-200 mg total) by mouth every 4 (four) hours as needed for cough.  Marland Kitchen HYDROcodone-homatropine (HYCODAN) 5-1.5 MG/5ML syrup Take 5 mLs by mouth every 6 (six) hours as needed for cough.  Marland Kitchen oseltamivir (TAMIFLU) 75 MG capsule Take 1 capsule (75 mg total) by mouth every 12 (twelve) hours.  . [DISCONTINUED] budesonide-formoterol (SYMBICORT) 160-4.5 MCG/ACT inhaler INHALE 2 PUFFS FIRST THING IN THE MORNING THEN 2 PUFFS 12 HOURS LATER  . budesonide-formoterol (SYMBICORT) 160-4.5 MCG/ACT inhaler Inhale 2 puffs into the lungs every 12 (twelve) hours.   Facility-Administered Encounter Medications as of 05/15/2018  Medication  . levalbuterol (XOPENEX) nebulizer solution 0.63 mg     Review of Systems  Constitutional:   No  weight loss, night sweats,  fevers, chills, fatigue, or  lassitude HEENT:  +nasal congestion at times  No headaches,  Difficulty swallowing,  Tooth/dental problems, or  Sore throat, No sneezing, itching, ear ache CV: No chest pain,  orthopnea, PND, swelling in lower extremities, anasarca, dizziness, palpitations, syncope  GI: No heartburn, indigestion, abdominal pain, nausea, vomiting, diarrhea, change in bowel habits, loss of appetite, bloody stools Resp: +sob with exertion, productive cough with thick yellow mucous No coughing up of blood.    No chest wall deformity Skin: no rash, lesions, no skin changes. GU: no dysuria, change in color of urine, no urgency or frequency.  No flank pain, no hematuria  MS:  No joint pain or swelling.  No decreased range of motion.  No back pain. Psych:  No change in mood or affect.  No depression or anxiety.  No memory loss.   Physical Exam  BP 118/80   Pulse 86   Temp (!) 97.3 F (36.3 C) (Oral)   Ht 5\' 10"  (1.778 m)   Wt 160 lb 6.4 oz (72.8 kg)   SpO2 97%   BMI 23.02 kg/m   Wt Readings from Last 3 Encounters:  05/15/18 160 lb 6.4 oz (72.8 kg)  12/23/16 170 lb (77.1 kg)  03/20/16 162 lb 12.8 oz (73.8 kg)    GEN: A/Ox3; pleasant , NAD, well nourished    HEENT:  [place]/AT,  EACs-clear, TMs-wnl, NOSE-clear, THROAT-clear, no lesions, no postnasal drip or exudate noted.   NECK:  Supple w/ fair ROM; no JVD; no lymphadenopathy.    RESP:  +inspiratory and expiratory wheezes, air movement in lobes, decreased on R>L  no accessory muscle use, no dullness to percussion  CARD:  RRR, no m/r/g, no peripheral edema, pulses intact, no cyanosis or clubbing.  GI:   Soft &  nt; nml bowel sounds; no organomegaly or masses detected.   Musco: Warm bil, no deformities or joint swelling noted.   Neuro: alert, no focal deficits noted.    Skin: Warm, no lesions or rashes   FeNo in office: 36 Breathing treatment in office Respiratory: Significantly improved air movement in all lobes, minor expiratory wheeze   Lab Results:  CBC    Component Value Date/Time   WBC 5.5 10/03/2012 1117   RBC 5.07 10/03/2012 1117   HGB 13.8 10/03/2012 1117   HCT 42.3 10/03/2012 1117   PLT 335 10/03/2012 1117   MCV 83.4 10/03/2012 1117   MCH 27.2 10/03/2012 1117   MCHC 32.6 10/03/2012 1117   RDW 13.9 10/03/2012 1117   LYMPHSABS 1.2 10/03/2012 1117   MONOABS 0.2 10/03/2012 1117   EOSABS 0.4 10/03/2012 1117   BASOSABS 0.0 10/03/2012 1117    BMET    Component Value Date/Time   NA 135 10/03/2012 1117   K 3.8 10/03/2012 1117   CL 98 10/03/2012 1117   CO2 28 10/03/2012 1117   GLUCOSE 111 (H) 10/03/2012 1117   BUN 12 10/03/2012 1117   CREATININE 1.08 10/03/2012 1117   CALCIUM 9.2 10/03/2012 1117   GFRNONAA 89 (L) 10/03/2012 1117   GFRAA >90 10/03/2012 1117    BNP No results  found for: BNP  ProBNP No results found for: PROBNP  Imaging: No results found.   Assessment & Plan:   Asthma exacerbation today  Pleasant 37 year old patient seen office today.  Feno today is 36.  Unfortunately patient has been off of Symbicort for the last 3 months.  Patient reports that primary care was managing writing for this by his primary care provider passed away and now he has an appointment with primary care on Monday of next week.  Encourage patient to keep that appointment and continue follow-up with primary care.   Discussed with patient that symptoms today are probably multifactorial with recent jobs, being off of controller therapies, and continued smoking.  Encourage patient to stop smoking.  Provided resources on his discharge paperwork.  Encourage patient to wear mask whenever he is working around dust or other things that may trigger his asthma.  Provided Symbicort 160 samples today to allow patient to get started with controller therapies again, placed prescription for Symbicort 160 as well.  Discussed how to take Symbicort as well as maintenance care is as far as rinsing mouth out and following up with our office if any changes in his breathing occur.  Emphasized the importance to the patient that he needs to have follow-up with our office.  And at least be seen annually.  Patient agrees and looks forward to his appointment to see Dr. Sherene Sires in 2 months.  Patient knows that he can call our office if he is having any changes in his respiratory status or concerns about his breathing before then.  Chronic asthma Symbicort 160 Samples  >>> provided today  >>> Take 2 puffs in the morning right when you wake up, rinse your mouth out after, 12 hours later take 2 puffs rinse her mouth out after >>> Use this every day no matter what >>> We need to know if you have breaks in therapy or having issues affording these medications  Continue rescue inhaler (ProAir air) as needed for  shortness of breath or wheezing, if you are having to use this more than 2 times a week please notify our office or your primary care doctor  Wear mask  when working when able, dust and other particles can definitely increase your risk of having lung issues  1 800 QUIT NOW  >>> Patient to call this resource and utilize it to help support her quit smoking >>> Keep up your hard work with stopping smoking  You can also contact the [facility] >>>For smoking cessation classes call [phone]    Follow back up with Dr. Sherene SiresWert in 2 months.    Smoker I agree with you that it is appropriate for you to stop smoking.  Stopping smoking will help limit your chances of asthma exacerbations, as well as COPD or lung issues in the future.  It is also very difficult for us to manage your respiratory status if you are continuously exposed to triggers such as cigarette smoke.   1 800 QUIT NOW  >>> Patient to call this resource and utilize it to help support her quit smoking >>> Keep up your hard work with stopping smoking  You can also contact the [facility] >>>For smoking cessation classes call [phone]    Follow back up with Dr. Sherene SiresWert in 2 months.       Coral CeoBrian P Mack, NP 05/15/2018

## 2018-05-15 NOTE — Assessment & Plan Note (Signed)
I agree with you that it is appropriate for you to stop smoking.  Stopping smoking will help limit your chances of asthma exacerbations, as well as COPD or lung issues in the future.  It is also very difficult for us to manage your respiratory status if you are continuously exposed to triggers such as cigarette smoke.   1 800 QUIT NOW  >>> Patient to call this resource and utilize it to help support her quit smoking >>> Keep up your hard work with stopping smoking  You can also contact the [facility] >>>For smoking cessation classes call [phone]    Follow back up with Dr. Sherene SiresWert in 2 months.

## 2018-05-15 NOTE — Assessment & Plan Note (Signed)
Symbicort 160 Samples  >>> provided today  >>> Take 2 puffs in the morning right when you wake up, rinse your mouth out after, 12 hours later take 2 puffs rinse her mouth out after >>> Use this every day no matter what >>> We need to know if you have breaks in therapy or having issues affording these medications  Continue rescue inhaler (ProAir air) as needed for shortness of breath or wheezing, if you are having to use this more than 2 times a week please notify our office or your primary care doctor  Wear mask when working when able, dust and other particles can definitely increase your risk of having lung issues  1 800 QUIT NOW  >>> Patient to call this resource and utilize it to help support her quit smoking >>> Keep up your hard work with stopping smoking  You can also contact the [facility] >>>For smoking cessation classes call [phone]    Follow back up with Dr. Sherene SiresWert in 2 months.

## 2018-05-15 NOTE — Progress Notes (Signed)
Discussed results with patient office visit.  Nothing further is needed at this time.  Elisha HeadlandBrian Kailyn Vanderslice FNP

## 2018-05-15 NOTE — Patient Instructions (Addendum)
Symbicort 160 Samples  >>> provided today  >>> Take 2 puffs in the morning right when you wake up, rinse your mouth out after, 12 hours later take 2 puffs rinse her mouth out after >>> Use this every day no matter what >>> We need to know if you have breaks in therapy or having issues affording these medications  Continue rescue inhaler (ProAir air) as needed for shortness of breath or wheezing, if you are having to use this more than 2 times a week please notify our office or your primary care doctor  Wear mask when working when able, dust and other particles can definitely increase your risk of having lung issues  1 800 QUIT NOW  >>> Patient to call this resource and utilize it to help support her quit smoking >>> Keep up your hard work with stopping smoking  You can also contact the [facility] >>>For smoking cessation classes call [phone]    Follow back up with Dr. Sherene SiresWert in 2 months.    Please contact the office if your symptoms worsen or you have concerns that you are not improving.   Thank you for choosing [facility] for your healthcare, and for allowing us to partner with you on your healthcare journey. I am thankful to be able to provide care to you today.   Elisha HeadlandBrian Qiana Landgrebe FNP-C

## 2018-05-15 NOTE — Progress Notes (Signed)
Chart and office note reviewed in detail  > agree with a/p as outlined    

## 2018-07-17 ENCOUNTER — Encounter: Payer: Self-pay | Admitting: Internal Medicine

## 2018-07-17 ENCOUNTER — Ambulatory Visit (INDEPENDENT_AMBULATORY_CARE_PROVIDER_SITE_OTHER)
Admission: RE | Admit: 2018-07-17 | Discharge: 2018-07-17 | Disposition: A | Discharge status: Home / Self Care | Payer: Medicaid Other | Source: Ambulatory Visit | Attending: Internal Medicine | Admitting: Internal Medicine

## 2018-07-17 ENCOUNTER — Ambulatory Visit (INDEPENDENT_AMBULATORY_CARE_PROVIDER_SITE_OTHER): Payer: Medicaid Other | Admitting: Internal Medicine

## 2018-07-17 VITALS — BP 110/82 | HR 72 | Ht 70.0 in | Wt 163.0 lb

## 2018-07-17 DIAGNOSIS — F17210 Nicotine dependence, cigarettes, uncomplicated: Secondary | ICD-10-CM

## 2018-07-17 DIAGNOSIS — J4530 Mild persistent asthma, uncomplicated: Secondary | ICD-10-CM | POA: Diagnosis not present

## 2018-07-17 MED ORDER — BUDESONIDE-FORMOTEROL FUMARATE 160-4.5 MCG/ACT IN AERO: 2.0000 | INHALATION_SPRAY | Freq: Two times a day (BID) | RESPIRATORY_TRACT | 6 refills | Status: DC

## 2018-07-17 NOTE — Assessment & Plan Note (Addendum)
-  PFT's 03/23/2014 nl p am symbicort   - 05/15/18 - FeNo >> 36  - Spirometry 07/17/2018  FEV1 4.71 (130%)  Ratio 79 with no curvature p symb 160 x 2   -  07/17/2018  After extensive coaching inhaler device,  effectiveness =    90%    When uses symbicort 160 consistently despite smoking >  All goals of chronic asthma control met including optimal function and elimination of symptoms with minimal need for rescue therapy.  Contingencies discussed in full including contacting this office immediately if not controlling the symptoms using the rule of two's.      I had an extended discussion with the patient reviewing all relevant studies completed to date and  lasting 15 to 20 minutes of a 25 minute visit    See device teaching which extended face to face time for this visit.  Each maintenance medication was reviewed in detail including emphasizing most importantly the difference between maintenance and prns and under what circumstances the prns are to be triggered using an action plan format that is not reflected in the computer generated alphabetically organized AVS which I have not found useful in most complex patients, especially with respiratory illnesses  Please see AVS for specific instructions unique to this visit that I personally wrote and verbalized to the the pt in detail and then reviewed with pt  by my nurse highlighting any  changes in therapy recommended at today's visit to their plan of care.

## 2018-07-17 NOTE — Progress Notes (Signed)
Subjective:    Patient ID: Nicholas [name], male    DOB: 09/27/1981  MRN: [id number]    Brief patient profile:  37 yobm cig/MJ smoker  yobm   bad bronchitis as child but resolved around age 37 then dx as asthma age 37 [facility] for asthma with completely nl pfts while one laba/ics documented 03/23/2014       History of Present Illness  02/09/2014 1st  Pulmonary office visit/ Wert  Chief Complaint  Patient presents with  . Pulmonary Consult    ref[facility] by Dr. [facility] for Asthma  using albuterol hfa each am  > lasts about 6 hours in relief of wheezing and sob, not aerobically active. rec symbicort 160 Take 2 puffs first thing in am and then another 2 puffs about 12 hours later.  Only use your albuterol as needed     03/20/2016  f/u ov/Wert re: asthma/ still smoking  Chief Complaint  Patient presents with  . Follow-up    Pt states his breathing is overall doing well. He ran out of symbicort 3 wks ago and has been using his son's albuterol inhaler 2 x daily on average. No new co's today.    does fine as long as stays on symbicort despite smoking but much worse gradually with sob > cough when off it rec Plan A = Automatic = Symbicort 160 Take 2 puffs first thing in am and then another 2 puffs about 12 hours later.  Plan B = Backup Only use your albuterol (PROAIR) as a rescue medication The key is to stop smoking completely before smoking completely stops you - it's not too late - ok to try the e cigs again    07/17/2018  f/u ov/Wert re: asthma / non-adherent/ still smoking Chief Complaint  Patient presents with  . Follow-up    Early morning wheezing but after taking inhaler it gets better.  Dyspnea:  As long as takes symbicort good ex tol inlcuding steps  Cough: some noct cough maybe once a week s am flare Sleeping: most noct sleeps fine on side flat bed  SABA use: does not have one 02: none    No obvious day  to day or daytime variability or assoc excess/ purulent sputum or mucus plugs or hemoptysis or cp or chest tightness,  or overt sinus or hb symptoms.     Also denies any obvious fluctuation of symptoms with weather or environmental changes or other aggravating or alleviating factors except as outlined above   No unusual exposure hx or h/o childhood pna/ asthma or knowledge of premature birth.  Current Allergies, Complete Past Medical History, Past Surgical History, Family History, and Social History were reviewed in [facility] electronic [facility] record.  ROS  The following are not active complaints unless bolded Hoarseness, sore throat, dysphagia, dental problems, itching, sneezing,  nasal congestion or discharge of excess mucus or purulent secretions, ear ache,   fever, chills, sweats, unintended wt loss or wt gain, classically pleuritic or exertional cp,  orthopnea pnd or arm/hand swelling  or leg swelling, presyncope, palpitations, abdominal pain, anorexia, nausea, vomiting, diarrhea  or change in bowel habits or change in bladder habits, change in stools or change in urine, dysuria, hematuria,  rash, arthralgias, visual complaints, headache, numbness, weakness or ataxia or problems with walking or coordination,  change in mood or  memory.        Current Meds  Medication Sig  .     .Marland Kitchen  benzonatate (TESSALON) 100 MG capsule Take 1 capsule (100 mg total) by mouth every 8 (eight) hours.  . budesonide-formoterol (SYMBICORT) 160-4.5 MCG/ACT inhaler INHALE 2 PUFFS FIRST THING IN THE MORNING THEN 2 PUFFS 12 HOURS LATER  . diclofenac (CATAFLAM) 50 MG tablet Take 1 tablet (50 mg total) by mouth 3 (three) times daily.  . fluticasone (FLONASE) 50 MCG/ACT nasal spray Place 2 sprays into both nostrils daily.                         Objective:   Physical Exam  03/23/2014       181 >  03/20/2016 163 > 07/17/2018  163     02/09/14 176 lb (79.833 kg)  01/24/11 180 lb 8 oz (81.874 kg)    Vital  signs reviewed - Note on arrival 02 sats  99% on RA     HEENT: nl dentition, turbinates bilaterally, and oropharynx. Nl external ear canals without cough reflex   NECK :  without JVD/Nodes/TM/ nl carotid upstrokes bilaterally   LUNGS: no acc muscle use,  Nl contour chest which is clear to A and P bilaterally without cough on insp or exp maneuvers   CV:  RRR  no s3 or murmur or increase in P2, and no edema   ABD:  soft and nontender with nl inspiratory excursion in the supine position. No bruits or organomegaly appreciated, bowel sounds nl  MS:  Nl gait/ ext warm without deformities, calf tenderness, cyanosis or clubbing No obvious joint restrictions   SKIN: warm and dry without lesions    NEURO:  alert, approp, nl sensorium with  no motor or cerebellar deficits apparent.     CXR PA and Lateral:   07/17/2018 :    I personally reviewed images and agree with radiology impression as follows:   No active cardiopulmonary disease.      Assessment & Plan:

## 2018-07-17 NOTE — Patient Instructions (Signed)
Plan A = Automatic = symbicort 160 Take 2 puffs first thing in am and then another 2 puffs about 12 hours later.    Plan B = Backup Only use your albuterol as a rescue medication to be used if you can't catch your breath by resting or doing a relaxed purse lip breathing pattern.  - The less you use it, the better it will work when you need it. - Ok to use the inhaler up to 2 puffs  every 4 hours if you must but call for appointment if use goes up over your usual need - Don't leave home without it !!  (think of it like the spare tire for your car)   Please remember to go to the  x-ray department downstairs in the basement  for your tests - we will call you with the results when they are available.    The key is to stop smoking completely before smoking completely stops you!   Please schedule a follow up visit in 6  months but call sooner if needed

## 2018-07-19 ENCOUNTER — Encounter: Payer: Self-pay | Admitting: Internal Medicine

## 2018-07-19 NOTE — Assessment & Plan Note (Signed)
4-5 min discussion re active cigarette smoking in addition to office E&M  Ask about tobacco use:   ongoing Advise quitting    I reviewed the Fletcher curve with the patient that basically indicates  if you quit smoking when your best day FEV1 is still well preserved (as is clearly  the case here)  it is highly unlikely you will progress to severe disease and informed the patient there was  no medication on the market that has proven to alter the curve/ its downward trajectory  or the likelihood of progression of their disease(unlike other chronic medical conditions such as atheroclerosis where we do think we can change the natural hx with risk reducing meds)    Therefore stopping smoking and maintaining abstinence are  the most important aspects of care, not choice of inhalers or for that matter, doctors.   Treatment other than smoking cessation  is entirely directed by severity of symptoms and focused also on reducing exacerbations, not attempting to change the natural history of the disease.   Assess willingness:  Not committed at this point Assist in quit attempt:  Per PCP when ready Arrange follow up:   Follow up per Primary Care planned       

## 2019-01-18 ENCOUNTER — Ambulatory Visit: Payer: Medicaid Other | Admitting: Internal Medicine

## 2019-01-22 ENCOUNTER — Ambulatory Visit: Payer: Medicaid Other | Admitting: Internal Medicine

## 2019-07-11 ENCOUNTER — Other Ambulatory Visit: Payer: Self-pay | Admitting: Pulmonary Disease

## 2019-07-11 DIAGNOSIS — J4530 Mild persistent asthma, uncomplicated: Secondary | ICD-10-CM

## 2019-09-07 ENCOUNTER — Telehealth: Payer: Self-pay | Admitting: Internal Medicine

## 2019-09-07 NOTE — Telephone Encounter (Signed)
Called back the patient to make him aware of Dr. Gustavus Bryant response. He agreed to bring medications to the appointment tomorrow and have refill issued if still appropriate. Nothing further needed at this time.

## 2019-09-07 NOTE — Telephone Encounter (Signed)
Message routed to Dr. Melvyn Novas:  Dr. Melvyn Novas, this patient has not been seen by you since 07/17/18, and was to follow up in 6 months. I called the patient and he stated that he has been out of the medication for a month and a half. He is scheduled to see you tomorrow.  Your note from the 07/17/18 visit:  "Instructions  Plan A = Automatic = symbicort 160 Take 2 puffs first thing in am and then another 2 puffs about 12 hours later.    Plan B = Backup Only use your albuterol as a rescue medication to be used if you can't catch your breath by resting or doing a relaxed purse lip breathing pattern.  - The less you use it, the better it will work when you need it. - Ok to use the inhaler up to 2 puffs  every 4 hours if you must but call for appointment if use goes up over your usual need - Don't leave home without it !!  (think of it like the spare tire for your car)   Please remember to go to the  x-ray department downstairs in the basement  for your tests - we will call you with the results when they are available.    The key is to stop smoking completely before smoking completely stops you!   Please schedule a follow up visit in 6  months but call sooner if needed "     Are you o.k with waiting until he sees you tomorrow for the appointment? Or do you want to proceed with a prescription being sent to the pharmacy.

## 2019-09-07 NOTE — Telephone Encounter (Signed)
Ok x one refill only though prefer she wait until ov to be sure it's still the best choice - make sure brings all meds with her

## 2019-09-08 ENCOUNTER — Other Ambulatory Visit: Payer: Self-pay

## 2019-09-08 ENCOUNTER — Encounter: Payer: Self-pay | Admitting: Internal Medicine

## 2019-09-08 ENCOUNTER — Ambulatory Visit (INDEPENDENT_AMBULATORY_CARE_PROVIDER_SITE_OTHER): Payer: Medicaid Other | Admitting: Internal Medicine

## 2019-09-08 DIAGNOSIS — F17210 Nicotine dependence, cigarettes, uncomplicated: Secondary | ICD-10-CM

## 2019-09-08 DIAGNOSIS — J4530 Mild persistent asthma, uncomplicated: Secondary | ICD-10-CM | POA: Diagnosis not present

## 2019-09-08 MED ORDER — BUDESONIDE-FORMOTEROL FUMARATE 160-4.5 MCG/ACT IN AERO: 2.0000 | INHALATION_SPRAY | Freq: Two times a day (BID) | RESPIRATORY_TRACT | 0 refills | Status: DC

## 2019-09-08 MED ORDER — ALBUTEROL SULFATE HFA 108 (90 BASE) MCG/ACT IN AERS: INHALATION_SPRAY | RESPIRATORY_TRACT | 1 refills | Status: DC

## 2019-09-08 MED ORDER — BUDESONIDE-FORMOTEROL FUMARATE 160-4.5 MCG/ACT IN AERO: INHALATION_SPRAY | RESPIRATORY_TRACT | 6 refills | Status: DC

## 2019-09-08 NOTE — Progress Notes (Signed)
Subjective:    Patient ID: Nicholas [name], male    DOB: 06-13-1981  MRN: [id number]    Brief patient profile:  69 yobm cig/MJ smoker  yobm   bad bronchitis as child but resolved around age 38 then dx as asthma age 19 after started smoking referred by [facility] 02/09/2014 to pulmonary clinic for asthma with completely nl pfts while one laba/ics documented 03/23/2014       History of Present Illness  02/09/2014 1st [facility] office visit/ [facility]  Chief Complaint  Patient presents with  . Pulmonary Consult    referred by Dr. Kennon Holter for Asthma  using albuterol hfa each am  > lasts about 6 hours in relief of wheezing and sob, not aerobically active. rec symbicort 160 Take 2 puffs first thing in am and then another 2 puffs about 12 hours later.  Only use your albuterol as needed     03/20/2016  f/u ov/[facility] re: asthma/ still smoking  Chief Complaint  Patient presents with  . Follow-up    Pt states his breathing is overall doing well. He ran out of symbicort 3 wks ago and has been using his son's albuterol inhaler 2 x daily on average. No new co's today.    does fine as long as stays on symbicort despite smoking but much worse gradually with sob > cough when off it rec Plan A = Automatic = Symbicort 160 Take 2 puffs first thing in am and then another 2 puffs about 12 hours later.  Plan B = Backup Only use your albuterol (PROAIR) as a rescue medication The key is to stop smoking completely before smoking completely stops you - it's not too late - ok to try the e cigs again    07/17/2018  f/u ov/[facility] re: asthma / non-adherent/ still smoking Chief Complaint  Patient presents with  . Follow-up    Early morning wheezing but after taking inhaler it gets better.  Dyspnea:  As long as takes symbicort good ex tol inlcuding steps  Cough: some noct cough maybe once a week s am flare Sleeping: most noct sleeps fine on side flat bed  SABA use: does not have one rec Plan A = Automatic =  Symbicort 160 Take 2 puffs first thing in am and then another 2 puffs about 12 hours later.  Plan B = Backup Only use your albuterol as a rescue medication  Please remember to go to the x-ray department downstairs in the basement  for your tests - we will call you with the results when they are available. The key is to stop smoking completely before smoking completely stops you!  Please schedule a follow up visit in 6  months but call sooner if needed      09/08/2019  f/u ov/[facility] re: asthma, out of meds x one month Chief Complaint  Patient presents with  . Follow-up    Pt states he has been out of his inhalers for approx 1 month and he has noticed some wheezing.   Dyspnea:  Better p takes son's albuterol  Cough: minimal/ clear mucus  Sleeping: using alb at hs helps / rarely wakes up wheezing in am  SABA use: about twice daily son's albuterol  02: none    No obvious day to day or daytime variability or assoc excess/ purulent sputum or mucus plugs or hemoptysis or cp or chest tightness, subjective wheeze or overt sinus or hb symptoms.    Also denies any obvious fluctuation of  symptoms with weather or environmental changes or other aggravating or alleviating factors except as outlined above   No unusual exposure hx or h/o childhood pna or knowledge of premature birth.  Current Allergies, Complete Past Medical History, Past Surgical History, Family History, and Social History were reviewed in [employer] record.  ROS  The following are not active complaints unless bolded Hoarseness, sore throat, dysphagia, dental problems, itching, sneezing,  nasal congestion or discharge of excess mucus or purulent secretions, ear ache,   fever, chills, sweats, unintended wt loss or wt gain, classically pleuritic or exertional cp,  orthopnea pnd or arm/hand swelling  or leg swelling, presyncope, palpitations, abdominal pain, anorexia, nausea, vomiting, diarrhea  or change in bowel  habits or change in bladder habits, change in stools or change in urine, dysuria, hematuria,  rash, arthralgias, visual complaints, headache, numbness, weakness or ataxia or problems with walking or coordination,  change in mood or  memory.               Objective:   Physical Exam  09/08/2019    170  03/23/2014       181 >  03/20/2016 163 > 07/17/2018  163     02/09/14 176 lb (79.833 kg)  01/24/11 180 lb 8 oz (81.874 kg)      Vital signs reviewed - Note on arrival 02 sats  96% on RA     HEENT : pt wearing mask not removed for exam due to covid -19 concerns.    NECK :  without JVD/Nodes/TM/ nl carotid upstrokes bilaterally   LUNGS: no acc muscle use,  Nl contour chest which is mid exp wheeze bilaterally without cough on insp or exp maneuvers   CV:  RRR  no s3 or murmur or increase in P2, and no edema   ABD:  soft and nontender with nl inspiratory excursion in the supine position. No bruits or organomegaly appreciated, bowel sounds nl  MS:  Nl gait/ ext warm without deformities, calf tenderness, cyanosis or clubbing No obvious joint restrictions   SKIN: warm and dry without lesions    NEURO:  alert, approp, nl sensorium with  no motor or cerebellar deficits apparent.      Assessment & Plan:

## 2019-09-08 NOTE — Assessment & Plan Note (Addendum)
Active smoker   - PFT's 03/23/2014 nl p am symbicort   - 05/15/18 - FeNo >> 36 - Spirometry 07/17/2018  FEV1 4.71 (130%)  Ratio 79 with no curvature p symb 160 x 2    - 09/08/2019  After extensive coaching inhaler device,  effectiveness =    90% > continue symb 160 2bid   As long as has access to symbicort 160 >>>  All goals of chronic asthma control met including optimal function and elimination of symptoms with minimal need for rescue therapy.  Contingencies discussed in full including contacting this office immediately if not controlling the symptoms using the rule of two's.      I had an extended discussion with the patient reviewing all relevant studies completed to date and  lasting 15 to 20 minutes of a 25 minute visit    I performed detailed device teaching using a teach back method which extended face to face time for this visit (see above)  Each maintenance medication was reviewed in detail including emphasizing most importantly the difference between maintenance and prns and under what circumstances the prns are to be triggered using an action plan format that is not reflected in the computer generated alphabetically organized AVS which I have not found useful in most complex patients, especially with respiratory illnesses  Please see AVS for specific instructions unique to this visit that I personally wrote and verbalized to the the pt in detail and then reviewed with pt  by my nurse highlighting any  changes in therapy recommended at today's visit to their plan of care.     

## 2019-09-08 NOTE — Assessment & Plan Note (Signed)
4-5 min discussion re active cigarette smoking in addition to office E&M  Ask about tobacco use:   ongoing Advise quitting  > 3 min Discussed the risks and costs (both direct and indirect)  of smoking relative to the benefits of quitting but patient unwilling to commit at this point to a specific quit date.   Although I certainly  don't endorse regular use of e cigs   I emphasized they should be considered a quick "one-way bridge" off all tobacco products and that he only use the commercial brands in the very short term and stop immediately if any resp symptoms worsen   ]Assess willingness:  Not committed at this point Assist in quit attempt:  Try the e cigs as above  Arrange follow up:  q 6 m

## 2019-09-08 NOTE — Patient Instructions (Addendum)
Plan A = Automatic = Always=    Symbicort Take 2 puffs first thing in am and then another 2 puffs about 12 hours later.   Work on inhaler technique:  relax and gently blow all the way out then take a nice smooth deep breath back in, triggering the inhaler at same time you start breathing in.  Hold for up to 5 seconds if you can. Blow out thru nose. Rinse and gargle with water when done     Plan B = Backup (to supplement plan A, not to replace it) Only use your albuterol inhaler as a rescue medication to be used if you can't catch your breath by resting or doing a relaxed purse lip breathing pattern.  - The less you use it, the better it will work when you need it. - Ok to use the inhaler up to 2 puffs  every 4 hours if you must but call for appointment if use goes up over your usual need - Don't leave home without it !!  (think of it like the spare tire for your car)    The key is to stop smoking completely before smoking completely stops you!   Although I certainly  don't endorse regular use of e cigs   I emphasized they should be considered a quick "one-way bridge" off all tobacco products and that he only use the commercial brands in the very short term and stop immediately if any resp symptoms worsen    Please schedule a follow up visit in 6 months but call sooner if needed

## 2019-12-09 ENCOUNTER — Other Ambulatory Visit: Payer: Self-pay

## 2019-12-09 DIAGNOSIS — Z20822 Contact with and (suspected) exposure to covid-19: Secondary | ICD-10-CM

## 2019-12-10 LAB — NOVEL CORONAVIRUS, NAA: SARS-CoV-2, NAA: DETECTED — AB

## 2019-12-11 ENCOUNTER — Telehealth: Payer: Self-pay | Admitting: Physician Assistant

## 2019-12-11 NOTE — Telephone Encounter (Signed)
Called to discuss with Jon Gills about Positive Covid -19 infection.   Pt is not qualified for Monoclonal infusion due to lack of identified risk factors and co-morbid conditions. He is currently asymptomatic. Symptoms reviewed as well as criteria for ending isolation.  Symptoms reviewed that would warrant ED/Hospital evaluation as well should her condition worsen. Preventative practices reviewed. Patient verbalized understanding.    Patient Active Problem List   Diagnosis Date Noted  . Cigarette smoker 03/25/2014  . Chronic asthma 01/24/2011  . Abnormal thyroid blood test 01/24/2011     Manson Passey, PA - C

## 2020-01-04 ENCOUNTER — Other Ambulatory Visit: Payer: Self-pay

## 2020-01-04 DIAGNOSIS — J4530 Mild persistent asthma, uncomplicated: Secondary | ICD-10-CM

## 2020-01-04 MED ORDER — ALBUTEROL SULFATE HFA 108 (90 BASE) MCG/ACT IN AERS: INHALATION_SPRAY | RESPIRATORY_TRACT | 1 refills | Status: DC

## 2020-02-08 ENCOUNTER — Other Ambulatory Visit: Payer: Self-pay

## 2020-02-08 ENCOUNTER — Encounter (HOSPITAL_COMMUNITY): Payer: Self-pay

## 2020-02-08 ENCOUNTER — Emergency Department (HOSPITAL_COMMUNITY)
Admission: EM | Admit: 2020-02-08 | Discharge: 2020-02-09 | Disposition: A | Discharge status: Home / Self Care | Payer: Medicaid Other | Attending: Emergency Medicine | Admitting: Emergency Medicine

## 2020-02-08 DIAGNOSIS — Y9259 Other trade areas as the place of occurrence of the external cause: Secondary | ICD-10-CM | POA: Insufficient documentation

## 2020-02-08 DIAGNOSIS — S43004A Unspecified dislocation of right shoulder joint, initial encounter: Secondary | ICD-10-CM | POA: Diagnosis not present

## 2020-02-08 DIAGNOSIS — X500XXA Overexertion from strenuous movement or load, initial encounter: Secondary | ICD-10-CM | POA: Diagnosis not present

## 2020-02-08 DIAGNOSIS — Y990 Civilian activity done for income or pay: Secondary | ICD-10-CM | POA: Insufficient documentation

## 2020-02-08 DIAGNOSIS — F17210 Nicotine dependence, cigarettes, uncomplicated: Secondary | ICD-10-CM | POA: Diagnosis not present

## 2020-02-08 DIAGNOSIS — Y9389 Activity, other specified: Secondary | ICD-10-CM | POA: Insufficient documentation

## 2020-02-08 DIAGNOSIS — Z91040 Latex allergy status: Secondary | ICD-10-CM | POA: Diagnosis not present

## 2020-02-08 DIAGNOSIS — J45909 Unspecified asthma, uncomplicated: Secondary | ICD-10-CM | POA: Diagnosis not present

## 2020-02-08 DIAGNOSIS — S4991XA Unspecified injury of right shoulder and upper arm, initial encounter: Secondary | ICD-10-CM | POA: Diagnosis present

## 2020-02-08 DIAGNOSIS — Z79899 Other long term (current) drug therapy: Secondary | ICD-10-CM | POA: Diagnosis not present

## 2020-02-08 MED ORDER — ETOMIDATE 2 MG/ML IV SOLN
0.1500 mg/kg | Freq: Once | INTRAVENOUS | Status: AC
  Administered 2020-02-09: 11.56 mg via INTRAVENOUS
  Filled 2020-02-08: qty 10

## 2020-02-08 NOTE — ED Triage Notes (Signed)
Patient arrived with [facility] after dislocating his right shoulder at work trying to catch a box.

## 2020-02-09 ENCOUNTER — Emergency Department (HOSPITAL_COMMUNITY): Payer: Medicaid Other

## 2020-02-09 NOTE — Sedation Documentation (Signed)
Shoulder back in place. Calling x-ray to verify placement.

## 2020-02-09 NOTE — ED Provider Notes (Addendum)
[facility] DEPT Provider Note: Georgena Spurling, MD, FACEP  CSN: [id number] MRN: [id number] ARRIVAL: 02/08/20 at [place]: RESA/RESA   CHIEF COMPLAINT  Dislocation (Shoulder)   HISTORY OF PRESENT ILLNESS  02/09/20 12:11 AM Nicholas [name] is a 39 y.o. male who injured his right shoulder while trying to catch a box at work just prior to arrival.  He is having pain in his right shoulder which he describes as a 9 out of 10, aching in nature.  It is worse with attempted movement.  Range of motion of the right shoulder is limited and there is an associated deformity suspicious for dislocation.  He denies numbness or functional deficit distally.  He denies other injury.  He has no history of previous dislocation.   Past Medical History:  Diagnosis Date  . Asthma     History reviewed. No pertinent surgical history.  No family history on file.  Social History   Tobacco Use  . Smoking status: Current Every Day Smoker    Packs/day: 0.25    Years: 18.00    Pack years: 4.50    Types: Cigarettes  . Smokeless tobacco: Never Used  . Tobacco comment: 5 cigarettes/day 05/15/18  Substance Use Topics  . Alcohol use: No    Comment: quit 5 years ago  . Drug use: No    Comment: quit 2 months ago    Prior to Admission medications   Medication Sig Start Date End Date Taking? Authorizing Provider  albuterol (PROAIR HFA) 108 (90 Base) MCG/ACT inhaler 2 puffs every 4 hours as needed only  if your can't catch your breath 01/04/20   Tanda Rockers, MD  budesonide-formoterol (SYMBICORT) 160-4.5 MCG/ACT inhaler INHALE 2 PUFFS FIRST THING IN THE MORNING THEN 2 PUFFS 12 HOURS LATER 09/08/19   Tanda Rockers, MD    Allergies Shrimp [shellfish allergy] and Latex   REVIEW OF SYSTEMS  Negative except as noted here or in the History of Present Illness.   PHYSICAL EXAMINATION  Initial Vital Signs Blood pressure (!) 118/103, pulse 80, temperature 98.2 F (36.8 C), temperature source Oral, resp.  rate 20, height 5\' 10"  (1.778 m), weight 72.1 kg, SpO2 98 %.  Examination General: Well-developed, well-nourished male in no acute distress; appearance consistent with age of record HENT: normocephalic; atraumatic Eyes: pupils equal, round and reactive to light; extraocular muscles intact Neck: supple Heart: regular rate and rhythm Lungs: clear to auscultation bilaterally Abdomen: soft; nondistended; nontender; bowel sounds present Extremities: Right shoulder deformity with decreased range of motion, right upper extremity distally neurovascularly intact Neurologic: Awake, alert and oriented; motor function intact in all extremities and symmetric; no facial droop Skin: Warm and dry Psychiatric: Normal mood and affect   RESULTS  Summary of this visit's results, reviewed and interpreted by myself:   EKG Interpretation  Date/Time:    Ventricular Rate:    PR Interval:    QRS Duration:   QT Interval:    QTC Calculation:   R Axis:     Text Interpretation:        Laboratory Studies: No results found for this or any previous visit (from the past 24 hour(s)). Imaging Studies: DG Shoulder Right Portable  Result Date: 02/09/2020 CLINICAL DATA:  39 year old male status post reduction of dislocated right shoulder. EXAM: PORTABLE RIGHT SHOULDER COMPARISON:  Earlier radiograph dated 02/08/2020. FINDINGS: Overall significant improvement of the previously seen dislocation. There is persistent slight inferior positioning of the right humeral head in relation to the glenoid concerning  for a degree of subluxation. Clinical correlation is recommended. Y-view and axial images may provide better evaluation. IMPRESSION: Interval reduction of the previously seen right shoulder dislocation with persistent inferior positioning of the humeral head in relation to the glenoid. Electronically Signed   By: Elgie Collard M.D.   On: 02/09/2020 01:05   DG Shoulder Right Port  Result Date: 02/09/2020 CLINICAL  DATA:  39 year old male with concern for dislocation of right. EXAM: PORTABLE RIGHT SHOULDER COMPARISON:  Chest radiograph dated 07/17/2018. FINDINGS: There is dislocation of the right shoulder. Evaluation is limited on this frontal view only images. The dislocation however appears anteriorly. No definite acute fracture. The soft tissues are unremarkable. IMPRESSION: Dislocated right shoulder. Electronically Signed   By: Elgie Collard M.D.   On: 02/09/2020 00:19    ED COURSE and MDM  Nursing notes, initial and subsequent vitals signs, including pulse oximetry, reviewed and interpreted by myself.  Vitals:   02/09/20 0038 02/09/20 0039 02/09/20 0040 02/09/20 0041  BP:   138/72   Pulse: 82 84 78 77  Resp: 16 17 16 14   Temp:      TempSrc:      SpO2: 100% 100% 100% 100%  Weight:      Height:       Medications  etomidate (AMIDATE) injection 11.56 mg (11.56 mg Intravenous Given 02/09/20 0031)   1:09 AM Patient advised of possible persistent subluxation and need for orthopedic follow-up.   PROCEDURES  .Sedation  Date/Time: 02/09/2020 12:22 AM Performed by: Luellen Howson, MD Authorized by: Oceanna Arruda, MD   Consent:    Consent obtained:  Verbal and written   Consent given by:  Patient   Risks discussed:  Respiratory compromise necessitating ventilatory assistance and intubation   Alternatives discussed:  Analgesia without sedation (Patient refuses narcotics) Universal protocol:    Procedure explained and questions answered to patient or proxy's satisfaction: yes     Relevant documents present and verified: yes     Test results available and properly labeled: yes     Imaging studies available: yes     Required blood products, implants, devices, and special equipment available: yes     Site/side marked: no     Immediately prior to procedure a time out was called: yes     Patient identity confirmation method:  Arm band and verbally with patient Indications:    Procedure performed:   Dislocation reduction   Procedure necessitating sedation performed by:  Different physician Pre-sedation assessment:    Time since last food or drink:  Several hours   ASA classification: class 1 - normal, healthy patient     Neck mobility: normal     Thyromental distance:  4 finger widths   Mallampati score:  I - soft palate, uvula, fauces, pillars visible   Pre-sedation assessments completed and reviewed: airway patency, cardiovascular function, hydration status, mental status, nausea/vomiting, pain level, respiratory function and temperature     Pre-sedation assessment completed:  02/09/2020 12:23 AM Immediate pre-procedure details:    Reassessment: Patient reassessed immediately prior to procedure     Reviewed: vital signs and relevant labs/tests     Verified: bag valve mask available, emergency equipment available, intubation equipment available, IV patency confirmed, oxygen available and suction available   Procedure details (see MAR for exact dosages):    Preoxygenation:  Nasal cannula   Sedation:  Etomidate   Intended level of sedation: deep   Analgesia:  None   Intra-procedure monitoring:  Blood pressure monitoring, cardiac  monitor, continuous capnometry, continuous pulse oximetry, frequent vital sign checks and frequent LOC assessments   Intra-procedure events comment:  Myoclonus   Intra-procedure management: none.   Total Provider sedation time (minutes):  20 Post-procedure details:    Post-sedation assessment completed:  02/09/2020 1:06 AM   Attendance: Constant attendance by certified staff until patient recovered     Recovery: Patient returned to pre-procedure baseline     Post-sedation assessments completed and reviewed: airway patency, cardiovascular function, hydration status, mental status, nausea/vomiting, pain level, respiratory function and temperature     Patient is stable for discharge or admission: yes     Patient tolerance:  Tolerated well, no immediate  complications .Ortho Injury Treatment  Date/Time: 02/09/2020 12:42 AM Performed by: Ollie Delano, MD Authorized by: Himmat Enberg, MD   Consent:    Consent obtained:  Verbal and written   Consent given by:  Patient   Alternatives discussed:  No treatmentInjury location: shoulder Location details: right shoulder Injury type: dislocation Dislocation type: anterior Hill-Sachs deformity: no Chronicity: new Pre-procedure neurovascular assessment: neurovascularly intact Pre-procedure distal perfusion: normal Pre-procedure neurological function: normal Pre-procedure range of motion: reduced  Anesthesia: Local anesthesia used: no  Patient sedated: Yes. Refer to sedation procedure documentation for details of sedation. Manipulation performed: yes Reduction method: Spaso technique Reduction successful: yes Immobilization: splint and sling Post-procedure neurovascular assessment: post-procedure neurovascularly intact Post-procedure distal perfusion: normal Post-procedure neurological function: normal Post-procedure range of motion: normal Patient tolerance: patient tolerated the procedure well with no immediate complications      ED DIAGNOSES     ICD-10-CM   1. Dislocation of right shoulder joint, initial encounter  S43.004A        Shaughn Thomley, Jonny Ruiz, MD 02/09/20 [id number]    Paula Libra, MD 02/09/20 [id number]

## 2020-02-09 NOTE — ED Notes (Signed)
CO2 monitoring set up at bedside and pt placed on cardiac monitoring.

## 2020-02-09 NOTE — ED Notes (Addendum)
Shoulder immobilizer in place post reduction

## 2020-02-09 NOTE — ED Notes (Signed)
X-ray at bedside

## 2020-03-09 ENCOUNTER — Ambulatory Visit: Payer: Medicaid Other | Admitting: Internal Medicine

## 2020-06-22 IMAGING — DX DG SHOULDER 2+V PORT*R*
2 series · 2 of 2 positions shown · non-contrast
Comparison: Chest radiograph dated 07/17/2018.

CLINICAL DATA: 38-year-old male with concern for dislocation of
right.

EXAM:
PORTABLE RIGHT SHOULDER

[abdomen kub (1 of 2)]
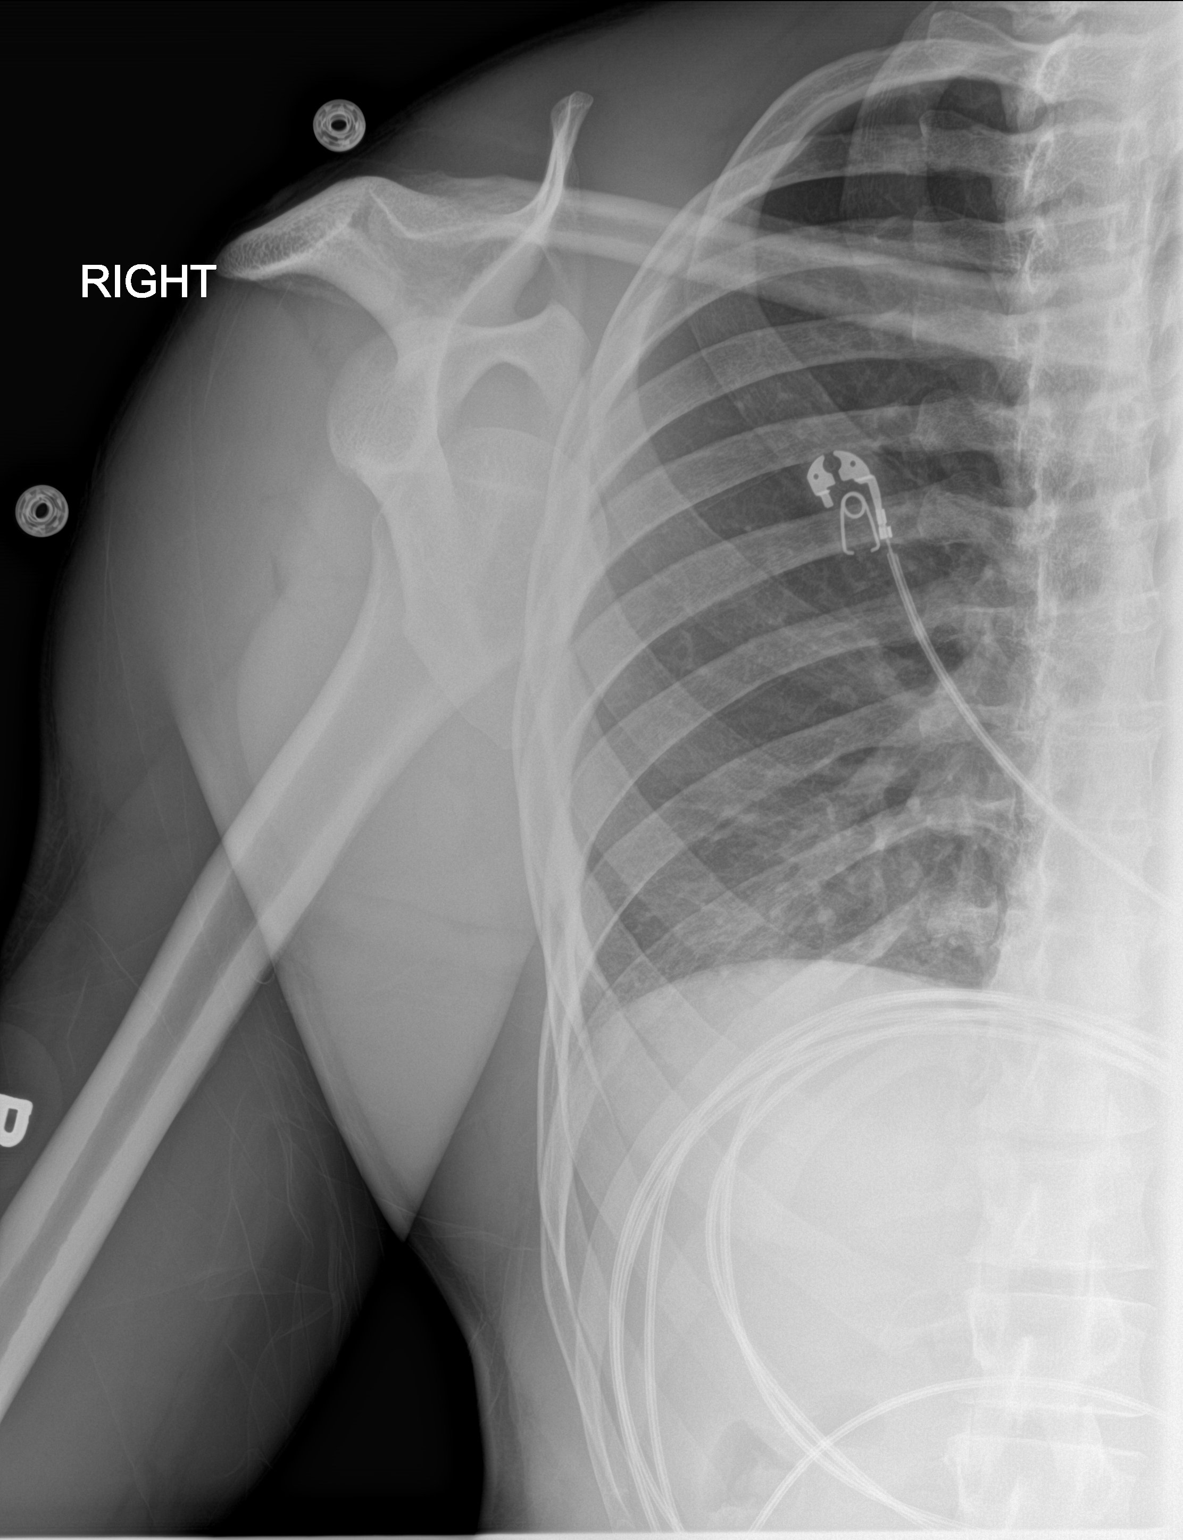

[abdomen kub (2 of 2)]
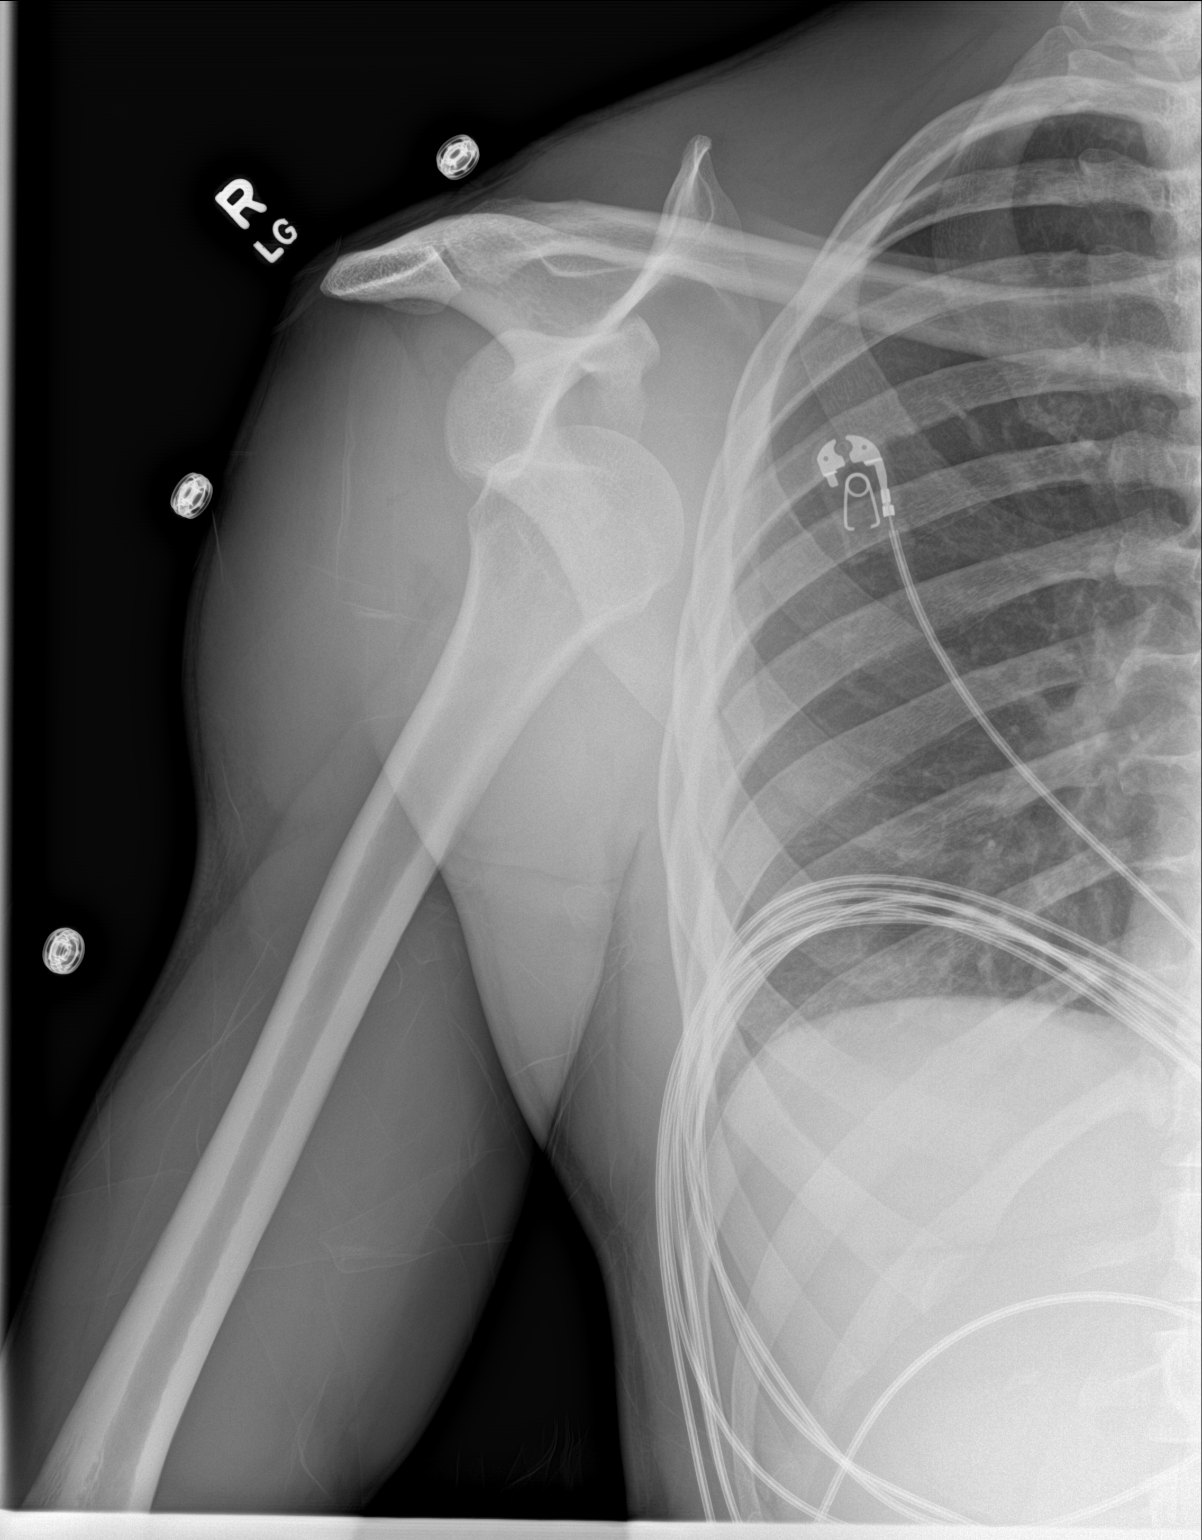

[2 of 2 positions shown; findings below may reference images not displayed]

FINDINGS: There is dislocation of the right shoulder. Evaluation is limited on
this frontal view only images. The dislocation however appears
anteriorly. No definite acute fracture. The soft tissues are
unremarkable.
IMPRESSION: Dislocated right shoulder.

## 2021-01-02 ENCOUNTER — Other Ambulatory Visit: Payer: Self-pay | Admitting: Internal Medicine

## 2021-01-02 DIAGNOSIS — J4530 Mild persistent asthma, uncomplicated: Secondary | ICD-10-CM

## 2021-01-30 ENCOUNTER — Ambulatory Visit (HOSPITAL_COMMUNITY)
Admission: EM | Admit: 2021-01-30 | Discharge: 2021-01-30 | Disposition: A | Discharge status: Home / Self Care | Payer: Medicaid Other | Attending: Family Medicine | Admitting: Family Medicine

## 2021-01-30 ENCOUNTER — Other Ambulatory Visit: Payer: Self-pay

## 2021-01-30 ENCOUNTER — Encounter (HOSPITAL_COMMUNITY): Payer: Self-pay

## 2021-01-30 DIAGNOSIS — F17210 Nicotine dependence, cigarettes, uncomplicated: Secondary | ICD-10-CM

## 2021-01-30 DIAGNOSIS — J4540 Moderate persistent asthma, uncomplicated: Secondary | ICD-10-CM | POA: Diagnosis not present

## 2021-01-30 DIAGNOSIS — J4530 Mild persistent asthma, uncomplicated: Secondary | ICD-10-CM

## 2021-01-30 MED ORDER — BUDESONIDE-FORMOTEROL FUMARATE 160-4.5 MCG/ACT IN AERO: INHALATION_SPRAY | RESPIRATORY_TRACT | 2 refills | Status: AC

## 2021-01-30 MED ORDER — ALBUTEROL SULFATE HFA 108 (90 BASE) MCG/ACT IN AERS: INHALATION_SPRAY | RESPIRATORY_TRACT | 2 refills | Status: AC

## 2021-01-30 NOTE — ED Triage Notes (Signed)
Pt presents for refill on symbicort inhaler.

## 2021-01-30 NOTE — ED Provider Notes (Signed)
[facility]    CSN: [id number] Arrival date & time: 01/30/21  1237      History   Chief Complaint Chief Complaint  Patient presents with  . Medication Refill    HPI Nicholas [name] is a 40 y.o. male.   Patient presenting today for asthma COPD follow-up.  States he is typically well controlled on Symbicort and as needed albuterol inhaler but has been out of these for about 5 months now.  Has been "self-medicating" with a humidifier with eucalyptus and peppermint but still having wheezing at nighttime.  Smokes cigarettes daily has for many years per patient.  Was without insurance the past 6 months and has not had a chance to get back with his primary care but plans to do so soon.  Denies chest pain, shortness of breath, fever, chronic cough.     Past Medical History:  Diagnosis Date  . Asthma     Patient Active Problem List   Diagnosis Date Noted  . Cigarette smoker 03/25/2014  . Chronic asthma 01/24/2011  . Abnormal thyroid blood test 01/24/2011    History reviewed. No pertinent surgical history.     Home Medications    Prior to Admission medications   Medication Sig Start Date End Date Taking? Authorizing Provider  albuterol (PROAIR HFA) 108 (90 Base) MCG/ACT inhaler 2 puffs every 4 hours as needed only  if your can't catch your breath 01/30/21   Particia Nearing, PA-C  budesonide-formoterol (SYMBICORT) 160-4.5 MCG/ACT inhaler INHALE 2 PUFFS BY MOUTH FIRST THING IN THE [place], THEN 2 PUFFS EVERY 12 HOURS LATER 01/30/21   Particia Nearing, PA-C    Family History History reviewed. No pertinent family history.  Social History Social History   Tobacco Use  . Smoking status: Current Every Day Smoker    Packs/day: 0.25    Years: 18.00    Pack years: 4.50    Types: Cigarettes  . Smokeless tobacco: Never Used  . Tobacco comment: 5 cigarettes/day 05/15/18  Substance Use Topics  . Alcohol use: No    Comment: quit 5 years ago  . Drug use: No     Comment: quit 2 months ago     Allergies   Shrimp [shellfish allergy] and Latex   Review of Systems Review of Systems Per HPI  Physical Exam Triage Vital Signs ED Triage Vitals  Enc Vitals Group     BP 01/30/21 1256 (!) 144/78     Pulse Rate 01/30/21 1256 71     Resp 01/30/21 1256 17     Temp 01/30/21 1256 98.6 F (37 C)     Temp Source 01/30/21 1256 Oral     SpO2 01/30/21 1256 98 %     Weight --      Height --      Head Circumference --      Peak Flow --      Pain Score 01/30/21 1255 0     Pain Loc --      Pain Edu? --      Excl. in GC? --    No data found.  Updated Vital Signs BP (!) 144/78 (BP Location: Left Arm)   Pulse 71   Temp 98.6 F (37 C) (Oral)   Resp 17   SpO2 98%   Visual Acuity Right Eye Distance:   Left Eye Distance:   Bilateral Distance:    Right Eye Near:   Left Eye Near:    Bilateral Near:  Physical Exam Vitals and nursing note reviewed.  Constitutional:      Appearance: Normal appearance.  HENT:     Head: Atraumatic.     Nose: Nose normal.     Mouth/Throat:     Mouth: Mucous membranes are moist.     Pharynx: Oropharynx is clear.  Eyes:     Extraocular Movements: Extraocular movements intact.     Conjunctiva/sclera: Conjunctivae normal.  Cardiovascular:     Rate and Rhythm: Normal rate and regular rhythm.     Heart sounds: Normal heart sounds.  Pulmonary:     Effort: Pulmonary effort is normal. No respiratory distress.     Breath sounds: Normal breath sounds. No wheezing or rales.  Musculoskeletal:        General: Normal range of motion.     Cervical back: Normal range of motion and neck supple.  Skin:    General: Skin is warm and dry.  Neurological:     General: No focal deficit present.     Mental Status: He is oriented to person, place, and time.  Psychiatric:        Mood and Affect: Mood normal.        Thought Content: Thought content normal.        Judgment: Judgment normal.    UC Treatments / Results   Labs (all labs ordered are listed, but only abnormal results are displayed) Labs Reviewed - No data to display  EKG   Radiology No results found.  Procedures Procedures (including critical care time)  Medications Ordered in UC Medications - No data to display  Initial Impression / Assessment and Plan / UC Course  I have reviewed the triage vital signs and the nursing notes.  Pertinent labs & imaging results that were available during my care of the patient were reviewed by me and considered in my medical decision making (see chart for details).     Lungs clear on exam today and vital signs reassuring aside from very mild hypertension in triage.  Will refill Symbicort and albuterol inhaler for as needed use and have him continue working on smoking cessation.  Follow-up with primary care in the next few weeks for recheck and further prescription.  Final Clinical Impressions(s) / UC Diagnoses   Final diagnoses:  Moderate persistent asthma without complication  Cigarette smoker   Discharge Instructions   None    ED Prescriptions    Medication Sig Dispense Auth. Provider   albuterol (PROAIR HFA) 108 (90 Base) MCG/ACT inhaler 2 puffs every 4 hours as needed only  if your can't catch your breath 18 g Particia Nearing, PA-C   budesonide-formoterol (SYMBICORT) 160-4.5 MCG/ACT inhaler INHALE 2 PUFFS BY MOUTH FIRST THING IN THE MORNING, THEN 2 PUFFS EVERY 12 HOURS LATER 10.2 g Particia Nearing, PA-C     PDMP not reviewed this encounter.   Particia Nearing, [place] 01/30/21 1317

## 2021-12-21 ENCOUNTER — Other Ambulatory Visit: Payer: Self-pay

## 2021-12-21 ENCOUNTER — Ambulatory Visit (HOSPITAL_COMMUNITY)
Admission: EM | Admit: 2021-12-21 | Discharge: 2021-12-21 | Disposition: A | Discharge status: Home / Self Care | Payer: Medicaid Other | Attending: Physician Assistant | Admitting: Physician Assistant

## 2021-12-21 ENCOUNTER — Encounter (HOSPITAL_COMMUNITY): Payer: Self-pay

## 2021-12-21 DIAGNOSIS — Z202 Contact with and (suspected) exposure to infections with a predominantly sexual mode of transmission: Secondary | ICD-10-CM | POA: Insufficient documentation

## 2021-12-21 DIAGNOSIS — Z113 Encounter for screening for infections with a predominantly sexual mode of transmission: Secondary | ICD-10-CM | POA: Insufficient documentation

## 2021-12-21 MED ORDER — METRONIDAZOLE 500 MG PO TABS: 2000.0000 mg | ORAL_TABLET | Freq: Once | ORAL | 0 refills | Status: AC

## 2021-12-21 NOTE — ED Triage Notes (Signed)
Pt presents for STD screening.   States his wife tested positive for Trichomoniasis.   States he does not have sxs.

## 2021-12-21 NOTE — Discharge Instructions (Signed)
Take metronidazole as prescribed.  Do not drink alcohol on the day you take this and for 3 days after as it would cause you to vomit.  This can upset your stomach so take it with food.  Please abstain from sex until 1 week after both you and partner have completed treatment.  If you have any questions or concerns please return for reevaluation.

## 2021-12-21 NOTE — ED Provider Notes (Signed)
[facility]    CSN: [id number] Arrival date & time: 12/21/21  3716      History   Chief Complaint Chief Complaint  Patient presents with   SEXUALLY TRANSMITTED DISEASE    HPI Nicholas [name] is a 41 y.o. male.   Patient presents today requesting STD testing.  Reports that his significant other was found to have trichomonas on her Pap smear and he is requesting treatment.  He has not had any significant symptoms including abdominal pain, nausea, vomiting, fever, penile discharge, dysuria, urinary frequency.  Denies any recent antibiotic use.  He does report one episode of penile discharge following a bowel movement several weeks ago but has not had additional episodes since that time.  He has had STIs in the past but reports completing treatment and having negative test of cure with last episode when he was in his 74s.  He declined additional STI testing including HIV, hepatitis, syphilis.  He is sexually active with his partner and in a monogamous relationship they do not use condoms regularly.   Past Medical History:  Diagnosis Date   Asthma     Patient Active Problem List   Diagnosis Date Noted   Cigarette smoker 03/25/2014   Chronic asthma 01/24/2011   Abnormal thyroid blood test 01/24/2011    History reviewed. No pertinent surgical history.     Home Medications    Prior to Admission medications   Medication Sig Start Date End Date Taking? Authorizing Provider  metroNIDAZOLE (FLAGYL) 500 MG tablet Take 4 tablets (2,000 mg total) by mouth once for 1 dose. 12/21/21 12/21/21 Yes Anyla Israelson, Noberto Retort, PA-C  albuterol (PROAIR HFA) 108 (90 Base) MCG/ACT inhaler 2 puffs every 4 hours as needed only  if your can't catch your breath 01/30/21   Particia Nearing, PA-C  budesonide-formoterol (SYMBICORT) 160-4.5 MCG/ACT inhaler INHALE 2 PUFFS BY MOUTH FIRST THING IN THE [place], THEN 2 PUFFS EVERY 12 HOURS LATER 01/30/21   Particia Nearing, PA-C    Family  History History reviewed. No pertinent family history.  Social History Social History   Tobacco Use   Smoking status: Every Day    Packs/day: 0.25    Years: 18.00    Pack years: 4.50    Types: Cigarettes   Smokeless tobacco: Never   Tobacco comments:    5 cigarettes/day 05/15/18  Substance Use Topics   Alcohol use: No    Comment: quit 5 years ago   Drug use: No    Comment: quit 2 months ago     Allergies   Shrimp [shellfish allergy] and Latex   Review of Systems Review of Systems  Constitutional:  Negative for activity change, appetite change, fatigue and fever.  Respiratory:  Negative for cough and shortness of breath.   Cardiovascular:  Negative for chest pain.  Gastrointestinal:  Negative for abdominal pain, diarrhea, nausea and vomiting.  Genitourinary:  Negative for dysuria, flank pain, penile discharge (1 episode), penile pain, penile swelling and testicular pain.    Physical Exam Triage Vital Signs ED Triage Vitals  Enc Vitals Group     BP 12/21/21 0824 124/76     Pulse Rate 12/21/21 0824 71     Resp 12/21/21 0824 17     Temp 12/21/21 0824 98.2 F (36.8 C)     Temp Source 12/21/21 0824 Oral     SpO2 12/21/21 0824 100 %     Weight --      Height --  Head Circumference --      Peak Flow --      Pain Score 12/21/21 0823 0     Pain Loc --      Pain Edu? --      Excl. in GC? --    No data found.  Updated Vital Signs BP 124/76 (BP Location: Left Arm)    Pulse 71    Temp 98.2 F (36.8 C) (Oral)    Resp 17    SpO2 100%   Visual Acuity Right Eye Distance:   Left Eye Distance:   Bilateral Distance:    Right Eye Near:   Left Eye Near:    Bilateral Near:     Physical Exam Vitals reviewed.  Constitutional:      General: He is awake.     Appearance: Normal appearance. He is well-developed. He is not ill-appearing.     Comments: Very pleasant male appears at age in no acute distress sitting comfortably in exam room  HENT:     Head:  Normocephalic and atraumatic.     Mouth/Throat:     Pharynx: No oropharyngeal exudate, posterior oropharyngeal erythema or uvula swelling.  Cardiovascular:     Rate and Rhythm: Normal rate and regular rhythm.     Heart sounds: Normal heart sounds, S1 normal and S2 normal. No murmur heard. Pulmonary:     Effort: Pulmonary effort is normal.     Breath sounds: Normal breath sounds. No stridor. No wheezing, rhonchi or rales.     Comments: Clear to auscultation bilaterally Abdominal:     General: Bowel sounds are normal.     Palpations: Abdomen is soft.     Tenderness: There is no abdominal tenderness.     Comments: Benign abdominal exam  Genitourinary:    Comments: Exam deferred Neurological:     Mental Status: He is alert.  Psychiatric:        Behavior: Behavior is cooperative.     UC Treatments / Results  Labs (all labs ordered are listed, but only abnormal results are displayed) Labs Reviewed  CYTOLOGY, (ORAL, ANAL, URETHRAL) ANCILLARY ONLY    EKG   Radiology No results found.  Procedures Procedures (including critical care time)  Medications Ordered in UC Medications - No data to display  Initial Impression / Assessment and Plan / UC Course  I have reviewed the triage vital signs and the nursing notes.  Pertinent labs & imaging results that were available during my care of the patient were reviewed by me and considered in my medical decision making (see chart for details).     Will empirically treat for trichomonas given known exposure.  STI swab was collected via self swab by patient and we will contact him if additional treatment is required.  Offered HIV, hepatitis, syphilis testing which he declined.  Discussed that he is not to drink any alcohol on the day that he takes metronidazole 2 g and for 3 days after due to potential Antabuse side effects.  Discussed that he should abstain from sexual activity until 1 week after both he and partner have completed  treatment.  Discussed that if he has any worsening symptoms including development of penile discharge, dysuria, fever, nausea, vomiting he should be seen again.  Strict return precautions given to which he expressed understanding.  Final Clinical Impressions(s) / UC Diagnoses   Final diagnoses:  Trichomonas exposure  Routine screening for STI (sexually transmitted infection)     Discharge Instructions  Take metronidazole as prescribed.  Do not drink alcohol on the day you take this and for 3 days after as it would cause you to vomit.  This can upset your stomach so take it with food.  Please abstain from sex until 1 week after both you and partner have completed treatment.  If you have any questions or concerns please return for reevaluation.     ED Prescriptions     Medication Sig Dispense Auth. Provider   metroNIDAZOLE (FLAGYL) 500 MG tablet Take 4 tablets (2,000 mg total) by mouth once for 1 dose. 4 tablet Equilla Que, Noberto Retort, PA-C      PDMP not reviewed this encounter.   Jeani Hawking, PA-C 12/21/21 0102

## 2021-12-24 LAB — CYTOLOGY, (ORAL, ANAL, URETHRAL) ANCILLARY ONLY
Chlamydia: NEGATIVE
Comment: NEGATIVE
Comment: NEGATIVE
Comment: NORMAL
Neisseria Gonorrhea: NEGATIVE
Trichomonas: POSITIVE — AB

## 2022-02-01 ENCOUNTER — Ambulatory Visit (HOSPITAL_COMMUNITY)
Admission: RE | Admit: 2022-02-01 | Discharge: 2022-02-01 | Disposition: A | Discharge status: Home / Self Care | Payer: Medicaid Other | Source: Ambulatory Visit | Attending: Family Medicine | Admitting: Family Medicine

## 2022-02-01 ENCOUNTER — Encounter (HOSPITAL_COMMUNITY): Payer: Self-pay

## 2022-02-01 ENCOUNTER — Other Ambulatory Visit: Payer: Self-pay

## 2022-02-01 VITALS — BP 120/76 | HR 61 | Temp 97.9°F | Resp 18

## 2022-02-01 DIAGNOSIS — Z20822 Contact with and (suspected) exposure to covid-19: Secondary | ICD-10-CM | POA: Insufficient documentation

## 2022-02-01 LAB — SARS CORONAVIRUS 2 (TAT 6-24 HRS): SARS Coronavirus 2: POSITIVE — AB

## 2022-02-01 NOTE — Discharge Instructions (Addendum)
You were seen today for covid test after exposure. This was done today and will be resulted tomorrow via MyChart.  As long as this is negative you can go back to work on Monday.  If positive, you will need to quarantine for at least 5 days.  ?

## 2022-02-01 NOTE — ED Provider Notes (Signed)
?[facility] ? ? ? ?CSN: [id number] ?Arrival date & time: 02/01/22  0941 ? ? ?  ? ?History   ?Chief Complaint ?Chief Complaint  ?Patient presents with  ? 10a appt  ? Covid Exposure  ? ? ?HPI ?Nicholas [name] is a 41 y.o. male.  ? ?Patient is here for covid testing.  ?His fiance was notified of positive test 2 days ago.   He is not having any symptoms at this time.  He did have a fever on Wednesday, but otherwise feeling well.  He needs a note to return to work if able.  ? ?Past Medical History:  ?Diagnosis Date  ? Asthma   ? ? ?Patient Active Problem List  ? Diagnosis Date Noted  ? Cigarette smoker 03/25/2014  ? Chronic asthma 01/24/2011  ? Abnormal thyroid blood test 01/24/2011  ? ? ?History reviewed. No pertinent surgical history. ? ? ? ? ?Home Medications   ? ?Prior to Admission medications   ?Medication Sig Start Date End Date Taking? Authorizing Provider  ?albuterol (PROAIR HFA) 108 (90 Base) MCG/ACT inhaler 2 puffs every 4 hours as needed only  if your can't catch your breath 01/30/21   Particia Nearing, PA-C  ?budesonide-formoterol (SYMBICORT) 160-4.5 MCG/ACT inhaler INHALE 2 PUFFS BY MOUTH FIRST THING IN THE [place], THEN 2 PUFFS EVERY 12 HOURS LATER 01/30/21   Particia Nearing, PA-C  ? ? ?Family History ?History reviewed. No pertinent family history. ? ?Social History ?Social History  ? ?Tobacco Use  ? Smoking status: Every Day  ?  Packs/day: 0.25  ?  Years: 18.00  ?  Pack years: 4.50  ?  Types: Cigarettes  ? Smokeless tobacco: Never  ? Tobacco comments:  ?  5 cigarettes/day 05/15/18  ?Substance Use Topics  ? Alcohol use: No  ?  Comment: quit 5 years ago  ? Drug use: No  ?  Comment: quit 2 months ago  ? ? ? ?Allergies   ?Shrimp [shellfish allergy] and Latex ? ? ?Review of Systems ?Review of Systems  ?Constitutional: Negative.   ?HENT: Negative.    ?Respiratory: Negative.    ?Cardiovascular: Negative.   ?Gastrointestinal: Negative.   ? ? ?Physical Exam ?Triage Vital Signs ?ED Triage Vitals   ?Enc Vitals Group  ?   BP 02/01/22 1006 120/76  ?   Pulse Rate 02/01/22 1006 61  ?   Resp 02/01/22 1006 18  ?   Temp 02/01/22 1006 97.9 ?F (36.6 ?C)  ?   Temp Source 02/01/22 1006 Oral  ?   SpO2 02/01/22 1006 97 %  ?   Weight --   ?   Height --   ?   Head Circumference --   ?   Peak Flow --   ?   Pain Score 02/01/22 1005 0  ?   Pain Loc --   ?   Pain Edu? --   ?   Excl. in GC? --   ? ?No data found. ? ?Updated Vital Signs ?BP 120/76 (BP Location: Left Arm)   Pulse 61   Temp 97.9 ?F (36.6 ?C) (Oral)   Resp 18   SpO2 97%  ? ?Visual Acuity ?Right Eye Distance:   ?Left Eye Distance:   ?Bilateral Distance:   ? ?Right Eye Near:   ?Left Eye Near:    ?Bilateral Near:    ? ?Physical Exam ?Constitutional:   ?   Appearance: Normal appearance.  ?HENT:  ?   Head: Normocephalic.  ?Cardiovascular:  ?  Rate and Rhythm: Normal rate and regular rhythm.  ?Pulmonary:  ?   Effort: Pulmonary effort is normal.  ?   Breath sounds: Normal breath sounds.  ?Abdominal:  ?   Palpations: Abdomen is soft.  ?Musculoskeletal:  ?   Cervical back: Normal range of motion and neck supple.  ?Neurological:  ?   Mental Status: He is alert.  ? ? ? ?UC Treatments / Results  ?Labs ?(all labs ordered are listed, but only abnormal results are displayed) ?Labs Reviewed - No data to display ? ?EKG ? ? ?Radiology ?No results found. ? ?Procedures ?Procedures (including critical care time) ? ?Medications Ordered in UC ?Medications - No data to display ? ?Initial Impression / Assessment and Plan / UC Course  ?I have reviewed the triage vital signs and the nursing notes. ? ?Pertinent labs & imaging results that were available during my care of the patient were reviewed by me and considered in my medical decision making (see chart for details). ? ?  ?Final Clinical Impressions(s) / UC Diagnoses  ? ?Final diagnoses:  ?Close exposure to COVID-19 virus  ? ? ? ?Discharge Instructions   ? ?  ?You were seen today for covid test after exposure. This was done today and  will be resulted tomorrow via MyChart.  As long as this is negative you can go back to work on Monday.  If positive, you will need to quarantine for at least 5 days.  ? ? ? ?ED Prescriptions   ?None ?  ? ?PDMP not reviewed this encounter. ?  ?Jannifer Franklin, MD ?02/01/22 1057 ? ?

## 2022-02-01 NOTE — ED Triage Notes (Signed)
4 days ago, Pt was notified by his fiance that she tested positive for covid. Pt presents today for testing to be cleared for work.  ?Notes one day of subjective fever 4 days ago that resolved with tylenol. Denies sxs. ?

## 2022-02-14 ENCOUNTER — Other Ambulatory Visit: Payer: Self-pay | Admitting: Internal Medicine

## 2022-02-14 DIAGNOSIS — J4530 Mild persistent asthma, uncomplicated: Secondary | ICD-10-CM

## 2022-03-19 ENCOUNTER — Ambulatory Visit: Payer: Medicaid Other | Admitting: Internal Medicine

## 2022-03-20 ENCOUNTER — Ambulatory Visit: Payer: Medicaid Other | Admitting: Internal Medicine

## 2022-03-20 NOTE — Progress Notes (Deleted)
Subjective:    Patient ID: Nicholas [name], male    DOB: 1981/03/27  MRN: [id number]    Brief patient profile:  47 yobm cig/MJ smoker  yobm   bad bronchitis as child but resolved around age 41 then dx as asthma age 67 after started smoking referred by [facility] 02/09/2014 to pulmonary clinic for asthma with completely nl pfts while one laba/ics documented 03/23/2014       History of Present Illness  02/09/2014 1st [facility] office visit/ Neli Fofana  Chief Complaint  Patient presents with   Pulmonary Consult    referred by Dr. Bruna Potter for Asthma  using albuterol hfa each am  > lasts about 6 hours in relief of wheezing and sob, not aerobically active. rec symbicort 160 Take 2 puffs first thing in am and then another 2 puffs about 12 hours later.  Only use your albuterol as needed   09/08/2019  f/u ov/Vashti Bolanos re: asthma, out of meds x one month Chief Complaint  Patient presents with   Follow-up    Pt states he has been out of his inhalers for approx 1 month and he has noticed some wheezing.   Dyspnea:  Better p takes son's albuterol  Cough: minimal/ clear mucus  Sleeping: using alb at hs helps / rarely wakes up wheezing in am  SABA use: about twice daily son's albuterol  02: none  Rec Plan A = Automatic = Always=    Symbicort Take 2 puffs first thing in am and then another 2 puffs about 12 hours later.  Work on inhaler technique:  Plan B = Backup (to supplement plan A, not to replace it) Only use your albuterol inhaler   03/20/2022  f/u ov/Stefhanie Kachmar re: asthma   maint on ***  No chief complaint on file.   Dyspnea:  *** Cough: *** Sleeping: *** SABA use: *** 02: *** Covid status:   ***   No obvious day to day or daytime variability or assoc excess/ purulent sputum or mucus plugs or hemoptysis or cp or chest tightness, subjective wheeze or overt sinus or hb symptoms.   *** without nocturnal  or early am exacerbation  of respiratory  c/o's or need for noct saba. Also denies any  obvious fluctuation of symptoms with weather or environmental changes or other aggravating or alleviating factors except as outlined above   No unusual exposure hx or h/o childhood pna/ asthma or knowledge of premature birth.  Current Allergies, Complete Past Medical History, Past Surgical History, Family History, and Social History were reviewed in [employer] record.  ROS  The following are not active complaints unless bolded Hoarseness, sore throat, dysphagia, dental problems, itching, sneezing,  nasal congestion or discharge of excess mucus or purulent secretions, ear ache,   fever, chills, sweats, unintended wt loss or wt gain, classically pleuritic or exertional cp,  orthopnea pnd or arm/hand swelling  or leg swelling, presyncope, palpitations, abdominal pain, anorexia, nausea, vomiting, diarrhea  or change in bowel habits or change in bladder habits, change in stools or change in urine, dysuria, hematuria,  rash, arthralgias, visual complaints, headache, numbness, weakness or ataxia or problems with walking or coordination,  change in mood or  memory.        No outpatient medications have been marked as taking for the 03/20/22 encounter (Appointment) with Nyoka Cowden, MD.  Objective:   Physical Exam  Wts  03/20/2022       ***  09/08/2019    170  03/23/2014       181 >  03/20/2016 163 > 07/17/2018  163     02/09/14 176 lb (79.833 kg)  01/24/11 180 lb 8 oz (81.874 kg)      Vital signs reviewed  03/20/2022  - Note at rest 02 sats  ***% on ***        mid exp wheeze bilaterally******        Assessment & Plan:

## 2022-08-17 ENCOUNTER — Encounter (HOSPITAL_COMMUNITY): Payer: Self-pay | Admitting: *Deleted

## 2022-08-17 ENCOUNTER — Ambulatory Visit (HOSPITAL_COMMUNITY)
Admission: EM | Admit: 2022-08-17 | Discharge: 2022-08-17 | Disposition: A | Discharge status: Home / Self Care | Payer: Medicaid Other | Attending: Family Medicine | Admitting: Family Medicine

## 2022-08-17 DIAGNOSIS — H9201 Otalgia, right ear: Secondary | ICD-10-CM | POA: Diagnosis not present

## 2022-08-17 NOTE — ED Triage Notes (Addendum)
Pt states he was using Q-tip in right ear after showering last night, and when he pulled out swab, cotton was missing. C/O some aching to right ear along with sensation of fullness, pain in ear with swallowing and breathing.

## 2022-08-19 NOTE — ED Provider Notes (Signed)
  [name]   [id number] 08/17/22 Arrival Time: 0626  ASSESSMENT & PLAN:  1. Acute otalgia, right     Discussed typical duration of viral illnesses. Viral testing declined. OTC symptom care as needed.  Discharge Medication List as of 08/17/2022 12:21 PM       Follow-up Information     [facility].   Why: If worsening or failing to improve as anticipated. Contact information: [address] [place] [address] [phone]                 Reviewed expectations re: course of current medical issues. Questions answered. Outlined signs and symptoms indicating need for more acute intervention. Understanding verbalized. After Visit Summary given.   SUBJECTIVE: History from: Patient. Nicholas [name] is a 41 y.o. male. Pt states he was using Q-tip in right ear after showering last night, and when he pulled out swab, cotton was missing. C/O some aching to right ear along with sensation of fullness, pain in ear with swallowing and breathing. No bleeding. No hearing changes.  OBJECTIVE:  Vitals:   08/17/22 1200  BP: 122/63  Pulse: (!) 55  Resp: 18  Temp: 98.1 F (36.7 C)  TempSrc: Oral  SpO2: 100%    General appearance: alert; no distress Eyes: PERRLA; EOMI; conjunctiva normal HENT: [place]; AT; with mild nasal congestion; no FB in either ear canal Neck: supple  Lungs: speaks full sentences without difficulty; unlabored Extremities: no edema Skin: warm and dry Neurologic: normal gait Psychological: alert and cooperative; normal mood and affect    Allergies  Allergen Reactions   Shrimp [Shellfish Allergy] Anaphylaxis   Latex Itching   Other     Clorox - skin irritation    Past Medical History:  Diagnosis Date   Asthma    Social History   Socioeconomic History   Marital status: Significant Other    Spouse name: Not on file   Number of children: Not on file   Years of education: Not on file    Highest education level: Not on file  Occupational History   Not on file  Tobacco Use   Smoking status: Every Day    Packs/day: 0.25    Years: 18.00    Total pack years: 4.50    Types: Cigarettes   Smokeless tobacco: Never   Tobacco comments:    5 cigarettes/day 05/15/18  Vaping Use   Vaping Use: Never used  Substance and Sexual Activity   Alcohol use: Yes    Comment: occasional   Drug use: Yes    Types: Marijuana   Sexual activity: Not on file  Other Topics Concern   Not on file  Social History Narrative   Not on file   Social Determinants of Health   Financial Resource Strain: Not on file  Food Insecurity: Not on file  Transportation Needs: Not on file  Physical Activity: Not on file  Stress: Not on file  Social Connections: Not on file  Intimate Partner Violence: Not on file   History reviewed. No pertinent family history. Past Surgical History:  Procedure Laterality Date   NO PAST SURGERIES       Vanessa Kick, MD 08/19/22 [phone]
